# Patient Record
Sex: Female | Born: 1935 | Race: Black or African American | Hispanic: No | Smoking: Never smoker
Health system: Southern US, Community
[De-identification: ages and names within clinical notes are randomized; demographics above are authoritative.]

## PROBLEM LIST (undated history)

## (undated) DIAGNOSIS — K449 Diaphragmatic hernia without obstruction or gangrene: Secondary | ICD-10-CM

## (undated) DIAGNOSIS — I10 Essential (primary) hypertension: Secondary | ICD-10-CM

## (undated) DIAGNOSIS — E7800 Pure hypercholesterolemia, unspecified: Secondary | ICD-10-CM

## (undated) DIAGNOSIS — K5790 Diverticulosis of intestine, part unspecified, without perforation or abscess without bleeding: Secondary | ICD-10-CM

## (undated) DIAGNOSIS — K219 Gastro-esophageal reflux disease without esophagitis: Secondary | ICD-10-CM

## (undated) HISTORY — PX: HYSTEROTOMY: SHX1776

## (undated) HISTORY — PX: LIVER BIOPSY: SHX301

## (undated) HISTORY — PX: ABDOMINAL HYSTERECTOMY: SHX81

---

## 2003-03-21 ENCOUNTER — Encounter: Admission: RE | Admit: 2003-03-21 | Discharge: 2003-03-21 | Payer: Self-pay | Admitting: Internal Medicine

## 2003-03-21 ENCOUNTER — Encounter: Payer: Self-pay | Admitting: Internal Medicine

## 2005-08-22 ENCOUNTER — Encounter: Admission: RE | Admit: 2005-08-22 | Discharge: 2005-08-22 | Payer: Self-pay | Admitting: Internal Medicine

## 2005-10-25 ENCOUNTER — Emergency Department (HOSPITAL_COMMUNITY): Admission: EM | Admit: 2005-10-25 | Discharge: 2005-10-26 | Payer: Self-pay | Admitting: Emergency Medicine

## 2006-01-09 ENCOUNTER — Emergency Department (HOSPITAL_COMMUNITY): Admission: EM | Admit: 2006-01-09 | Discharge: 2006-01-10 | Payer: Self-pay | Admitting: Emergency Medicine

## 2007-03-25 ENCOUNTER — Emergency Department (HOSPITAL_COMMUNITY): Admission: EM | Admit: 2007-03-25 | Discharge: 2007-03-25 | Payer: Self-pay | Admitting: Emergency Medicine

## 2007-06-02 ENCOUNTER — Emergency Department (HOSPITAL_COMMUNITY): Admission: EM | Admit: 2007-06-02 | Discharge: 2007-06-03 | Payer: Self-pay | Admitting: Emergency Medicine

## 2007-06-18 ENCOUNTER — Emergency Department (HOSPITAL_COMMUNITY): Admission: EM | Admit: 2007-06-18 | Discharge: 2007-06-19 | Payer: Self-pay | Admitting: Emergency Medicine

## 2007-06-19 ENCOUNTER — Encounter (INDEPENDENT_AMBULATORY_CARE_PROVIDER_SITE_OTHER): Payer: Self-pay | Admitting: *Deleted

## 2007-07-11 ENCOUNTER — Inpatient Hospital Stay (HOSPITAL_COMMUNITY): Admission: EM | Admit: 2007-07-11 | Discharge: 2007-07-16 | Payer: Self-pay | Admitting: Emergency Medicine

## 2007-07-12 ENCOUNTER — Encounter: Payer: Self-pay | Admitting: Gastroenterology

## 2007-07-12 ENCOUNTER — Encounter (INDEPENDENT_AMBULATORY_CARE_PROVIDER_SITE_OTHER): Payer: Self-pay | Admitting: *Deleted

## 2007-07-19 ENCOUNTER — Ambulatory Visit: Payer: Self-pay | Admitting: Gastroenterology

## 2007-08-31 ENCOUNTER — Encounter: Payer: Self-pay | Admitting: Gastroenterology

## 2009-04-18 DIAGNOSIS — M1990 Unspecified osteoarthritis, unspecified site: Secondary | ICD-10-CM | POA: Insufficient documentation

## 2009-04-18 DIAGNOSIS — M810 Age-related osteoporosis without current pathological fracture: Secondary | ICD-10-CM | POA: Insufficient documentation

## 2009-04-18 DIAGNOSIS — K219 Gastro-esophageal reflux disease without esophagitis: Secondary | ICD-10-CM | POA: Insufficient documentation

## 2009-12-21 DIAGNOSIS — R42 Dizziness and giddiness: Secondary | ICD-10-CM | POA: Insufficient documentation

## 2010-01-05 ENCOUNTER — Emergency Department (HOSPITAL_COMMUNITY): Admission: EM | Admit: 2010-01-05 | Discharge: 2010-01-05 | Payer: Self-pay | Admitting: Emergency Medicine

## 2010-03-21 ENCOUNTER — Encounter: Payer: Self-pay | Admitting: Gastroenterology

## 2010-03-22 ENCOUNTER — Telehealth: Payer: Self-pay | Admitting: Gastroenterology

## 2010-03-25 ENCOUNTER — Emergency Department (HOSPITAL_COMMUNITY): Admission: EM | Admit: 2010-03-25 | Discharge: 2010-03-26 | Payer: Self-pay | Admitting: Emergency Medicine

## 2010-07-16 NOTE — Procedures (Signed)
Summary: EGD and biopsy   EGD  Procedure date:  07/12/2007  Findings:      Findings: Duodenitis  Findings: Gastritis, Hiatal Hernia, GERD Location: [facility]    EGD  Procedure date:  07/12/2007  Findings:      Findings: Duodenitis  Findings: Gastritis, Hiatal Hernia, GERD Location: [facility]   Patient Name: [name], [name] MRN:  Procedure Procedures: Panendoscopy (EGD) CPT: 43235.    with biopsy(s)/brushing(s). CPT: D1846139.  Personnel: Endoscopist: Venita Lick. Russella Dar, MD, Clementeen Graham.  Referred By: [name], MD.  Exam Location: Exam performed in Endoscopy Suite. Inpatient-ward  Patient Consent: Procedure, Alternatives, Risks and Benefits discussed, consent obtained, from patient. Consent was obtained by the RN.  Indications Symptoms: Weight loss. Nausea. Anorexia. Reflux symptoms  History  Current Medications: Patient is not currently taking Coumadin.  Pre-Exam Physical: Performed Jul 12, 2007  Cardio-pulmonary exam, HEENT exam, Abdominal exam, Mental status exam WNL.  Comments: Pt. history reviewed/updated, physical exam performed prior to initiation of sedation?Yes Exam Exam Info: Maximum depth of insertion Duodenum, intended Duodenum. Vocal cords not visualized. Gastric retroflexion performed. ASA Classification: II. Tolerance: excellent.  Sedation Meds: Patient assessed and found to be appropriate for moderate (conscious) sedation. Fentanyl 50 mcg. given IV. Versed 4 mg. given IV. Cetacaine Spray 2 sprays given aerosolized.  Monitoring: BP and pulse monitoring done. Oximetry used. Supplemental O2 given  Findings Normal: Proximal Esophagus to Distal Esophagus.  HIATAL HERNIA: Regular, 4 cms. in length. ICD9: Hernia, Hiatal: 553.3. Normal: Fundus to Body.  MUCOSAL ABNORMALITY: Duodenal Bulb. Erythematous mucosa. ICD9: Duodenitis without Hemorrhage: 535.60.  MUCOSAL ABNORMALITY: Antrum to Pyloric Sphincter. Erosions present. Erythematous  mucosa. Granular mucosa. Biopsy/Mucosal Abn taken. ICD9: Gastritis, Unspecified: 535.50.  Normal: Duodenal 2nd Portion.   Assessment  Diagnoses: 553.3: Hernia, Hiatal.  535.50: Gastritis, Unspecified.  535.60: Duodenitis without Hemorrhage.  530.81: GERD.   Events  Unplanned Intervention: No unplanned interventions were required.  Unplanned Events: There were no complications. Plans Medication(s): Await pathology. PPI: QAM,   Patient Education: Patient given standard instructions for: Hiatal Hernia. Reflux. Mucosal Abnormality.  Comments: FINDING ON EGD DO NOT SEEM SIGNIFICANT ENOUGH TO EXPLAIN ALL HER SYMPTOMS BUT COULD EXPLAIN SOME SYMPTOMS. Disposition: After procedure patient sent to recovery. After recovery patient sent back to hospital.  Scheduling: Primary Care Provider, to [name], MD, Jul 12, 2007.    This report was created from the original endoscopy report, which was reviewed and signed by the above listed endoscopist.    cc: [name], MD         SP-Surgical Pathology - STATUS: Final  .                                         Perform Date: 26Jan09 00:01  Ordered By: Rica Records Date: 26Jan09 12:10  Facility: [facility]                              Department: CPATH  Service Report Text  [facility]   [address] [place], [place] [address]   [phone]    REPORT OF SURGICAL PATHOLOGY    Case #: [id number]   Patient Name: BOBIE, CARIS   Office Chart Number: N/A    MRN: [id number]  Pathologist: Ferd Hibbs. Colonel Bald, MD   DOB/Age 15-Aug-1924 (Age: 34) Gender: F   Date Taken: 07/12/2007   Date Received: 07/12/2007    FINAL DIAGNOSIS    ***MICROSCOPIC EXAMINATION AND DIAGNOSIS***    STOMACH, BIOPSY:   - MILD REACTIVE GASTROPATHY.   - THERE IS NO EVIDENCE OF MALIGNANCY.   - SEE COMMENT.    COMMENT   This pattern can be associated with nonsteroidal   anti-inflammatory drugs (NSAIDS),  alcohol or with other causes of   chemical gastropathy. Helicobacter pylori are not identified   with Warthin-Starry stain. The control stained appropriately.    gdt   Date Reported: 07/13/2007 Ferd Hibbs. Colonel Bald, MD   *** Electronically Signed Out By JBK ***    Clinical information   Gastritis and duodenitis (tc)    specimen(s) obtained   Stomach, biopsy    Gross Description   Received in formalin are tan, soft tissue fragments that are   submitted in toto. Number: three   Size: 0.1 to 0.3 cm (JBM:kv 07-12-07)    kv/   Additional Information  HL7 RESULT STATUS : F  External IF Update Timestamp : 2007-07-12:12:10:00.000000

## 2010-07-16 NOTE — Progress Notes (Signed)
Summary: appt concern  Phone Note Call from Patient Call back at Home Phone [phone]   Caller: Patient Call For: Dr. Russella Dar Reason for Call: Talk to Nurse Summary of Call: pt thinks she is returning a call to "Dr. Ardell Isaacs nurse" regarding a questions about a future appt...? Initial call taken by: Vallarie Mare,  March 22, 2010 9:53 AM  Follow-up for Phone Call        Pt states she is feeling better and wants to cancel her appt for Monday and will call back if she wants to reschedule.  Follow-up by: Christie Nottingham CMA Duncan Dull),  March 22, 2010 10:05 AM

## 2010-07-16 NOTE — Letter (Signed)
Summary: New Patient letter  [facility]  [address] [place], [place] [address]   Phone: [phone]  Fax: [phone]       03/21/2010 MRN: [id number]  [name] [address] [place], [place]  [address]  Dear [name],  Welcome to the Gastroenterology Division at [facility].    You are scheduled to see Dr.  Russella Dar on 03-25-10 at 10am on the 3rd floor at [facility], [address].  We ask that you try to arrive at our office 15 minutes prior to your appointment time to allow for check-in.  We would like you to complete the enclosed self-administered evaluation form prior to your visit and bring it with you on the day of your appointment.  We will review it with you.  Also, please bring a complete list of all your medications or, if you prefer, bring the medication bottles and we will list them.  Please bring your insurance card so that we may make a copy of it.  If your insurance requires a referral to see a specialist, please bring your referral form from your primary care physician.  Co-payments are due at the time of your visit and may be paid by cash, check or credit card.     Your office visit will consist of a consult with your physician (includes a physical exam), any laboratory testing he/she may order, scheduling of any necessary diagnostic testing (e.g. x-ray, ultrasound, CT-scan), and scheduling of a procedure (e.g. Endoscopy, Colonoscopy) if required.  Please allow enough time on your schedule to allow for any/all of these possibilities.    If you cannot keep your appointment, please call [phone] to cancel or reschedule prior to your appointment date.  This allows [place] the opportunity to schedule an appointment for another patient in need of care.  If you do not cancel or reschedule by 5 p.m. the business day prior to your appointment date, you will be charged a $50.00 late cancellation/no-show fee.    Thank you for choosing [facility] for your medical needs.  We appreciate the opportunity to care for you.  Please visit [place] at our website  to learn more about our practice.                     Sincerely,                                                             The Gastroenterology Division

## 2010-07-16 NOTE — Discharge Summary (Signed)
Summary: Nausea  NAME:  Madison, Proctor NO.:  [id number]      MEDICAL RECORD NO.:  [id number]          PATIENT TYPE:  INP      LOCATION:  1312                         FACILITY:  [facility]      PHYSICIAN:  Gwen Pounds, MD       DATE OF BIRTH:  Dec 17, 1924      DATE OF ADMISSION:  07/11/2007   DATE OF DISCHARGE:  07/16/2007                                  DISCHARGE SUMMARY      PRIMARY CARE PROVIDER:  Dr. Gwen Pounds.      GASTROENTEROLOGIST:  Dr. Russella Dar.      DISCHARGE DIAGNOSES:   1. Clostridium difficile colitis.   2. Nausea and anorexia.   3. Hypertension.   4. Weakness/failure to thrive.   5. Malnutrition.   6. Hypokalemia, status post repletion.   7. Known hiatal hernia, known gastroesophageal reflux disease and       known presbyesophagus.   8. Hyperlipidemia.   9. Osteoporosis.      DISCHARGE MEDICATIONS:  List includes:   1. Atenolol mg p.o. q.a.m.   2. Norvasc 10 mg p.o. daily.   3. Lipitor 40 mg p.o. q.a.m.   4. Aspirin 325 daily.   5. Protonix 40 mg p.o. daily.   6. Multivitamin one p.o. daily.   7. Vancomycin 125 mg in 2.5-mL solution four times per day through       July 26, 2007.   8. Cipro eye drops 0.3% two drops 3 times per day.   9. Florastor 250 mg p.o. b.i.d. through July 26 2007.   10.Tylenol as needed.   11.Benicar 40 mg, only take one-half per day; if too lightheaded,       dizzy or weak, hold this medication and call me.   12.Metoclopramide 10 mg one tablet 3 times per day if needed for       nausea.   13.Discontinue the hydrochlorothiazide.      AFTERCARE FOLLOWUP INSTRUCTIONS:  Follow up with Dr. Timothy Lasso in 2 weeks or   earlier if needed.      DISCHARGE PROCEDURES:   1. Gastroenterology consultation.   2. CT scan of the abdomen and pelvis showing small hiatal hernia,       stable left adrenal adenoma, colon diverticulosis, stable nodule on       the pancreatic head and sigmoid diverticulosis.   3. Medical management.        HISTORY OF PRESENT ILLNESS:  Briefly, [name] is an 77 year old   female who has been sick since July 19, 2007 with decreased appetite,   dry heaves, loose brown stools and decreased p.o. intake.  She finally   presented to medical attention on July 11, 2007 with worsening   anorexia, worsening oral intake, mild lightheadedness, vague lower chest   wall, substernal and chest discomfort, worsening GI issues, nausea and   diarrhea.  She denies any fevers or chills.  She has been on Flagyl and   Cipro for presumed diverticulitis from right around the [place]  Year's on.   She presented to the emergency department on July 11, 2007 with the   above-mentioned worsening symptoms; she was seen and examined.  She had   a relatively mild abdominal exam.  Her white count was 6.2.  Her albumin   was 3.  The rest of her exam lab work was unremarkable.  Potassium was   3.3.  EKG showed a normal sinus rhythm.  CT scan showed the above.  She   was admitted for further evaluation and treatment of nausea, weight loss   and failure to thrive.      HOSPITAL COURSE:  I saw her in followup the following morning on July 12, 2007 after she was admitted with continued nausea, vomiting,   diarrhea, failure to thrive, anorexia and weight loss.  I updated the   chart and wrote that she was diagnosed with diverticulitis on June 19, 2007 and took 10-11 days of Cipro and Flagyl; since then, she has had   her issues.  I saw her on July 12, 2007; she had a soft bowel   movement, no blood, no melena and she was having severe abdominal pain   overnight.  Her abdominal exam was unremarkable and was soft, nontender   and nondistended.  Bowel sounds were positive.  She had no rebound or   guarding.  All of her labs were reviewed.  She was Hemoccult-negative.   I went ahead and asked GI to see the patient because her abdominal exam   was out of portion to her symptoms and wanted to verify that  nothing   else was going on at the same time. Stool studies were sent.  Dr. Russella Dar   saw the patient and thought that we needed to go ahead and rule out GERD   and ulcer, Candida esophagitis, gastritis or incompletely treated   diverticulitis and actually went ahead and did an upper endoscopy.  Of   note, the upper endoscopy was relatively benign.  Biopsies done at the   time showed mild reactive gastropathy and no evidence of malignancy, but   there was a hiatal hernia and mild gastritis and mild duodenitis and   mild GERD, otherwise, again unremarkable.  Of note, later that day, the   C. difficile colitis toxin came back positive and vancomycin orally was   added and the Flagyl was discontinued.  The opinion was that because she   had just finished a course of Flagyl, that the Flagyl was ineffective at   protecting her from this, so we will go ahead and do the vancomycin for   a full course.  We also treated her with cholestyramine and probiotics.   We continued to rehydrate her and correct her electrolyte abnormalities.   Over the next ensuing days, her diarrhea slowly resolved, her p.o.   intake slowly improved, physical and occupational therapy help get her   closer to her baseline.  On July 16, 2007, she had 2 loose bowel   movements the day before, more firm, less abdominal pain, she was eating   more and having more strength and walking more.  All her vital signs   remained stable.  Her labs just had a prealbumin of 7.8, potassium was a   little low at 3.3, hemoglobin was 10.9, but her physical exam was   otherwise unremarkable, her strength was improving, as stated above, and   for her C. difficile colitis, we felt it was okay  for her to be   discharged home for a full 14-day course of vancomycin and probiotics.   At home, I want her to again increase her strength and eat more.  Her   family is going to pay very close attention to her until she returns to   her baseline.  She  did have what appeared to be a conjunctivitis and was   put on Cipro eye drops.  It was felt that we can go ahead and tighten   her   blood pressure and work on her medications as an outpatient.  On July 16, 2007, she was deemed medically stable and was discharged home in   stable condition to be closely evaluated by her family and to follow up   with me in 2 weeks after discharge.               Gwen Pounds, MD   Electronically Signed            JMR/MEDQ  D:  08/31/2007  T:  09/01/2007  Job:  [id number]      cc:   Venita Lick. Russella Dar, MD, FACG   [address]   [place]   [place] [address]

## 2010-08-31 LAB — COMPREHENSIVE METABOLIC PANEL
ALT: 15 U/L (ref 0–35)
Albumin: 3.4 g/dL — ABNORMAL LOW (ref 3.5–5.2)
Calcium: 9 mg/dL (ref 8.4–10.5)
Chloride: 106 mEq/L (ref 96–112)
Creatinine, Ser: 1.01 mg/dL (ref 0.4–1.2)
GFR calc Af Amer: >60 mL/min (ref >60)
Glucose, Bld: 103 mg/dL — ABNORMAL HIGH (ref 70–99)
Potassium: 3.9 mEq/L (ref 3.5–5.1)
Sodium: 138 mEq/L (ref 135–145)
Total Bilirubin: 0.5 mg/dL (ref 0.3–1.2)

## 2010-08-31 LAB — CBC
HCT: 35.1 % — ABNORMAL LOW (ref 36.0–46.0)
Hemoglobin: 11.2 g/dL — ABNORMAL LOW (ref 12.0–15.0)
MCHC: 31.9 g/dL (ref 30.0–36.0)
RDW: 16.7 % — ABNORMAL HIGH (ref 11.5–15.5)

## 2010-08-31 LAB — DIFFERENTIAL
Basophils Absolute: 0 10*3/uL (ref 0.0–0.1)
Basophils Relative: 0 % (ref 0–1)
Monocytes Absolute: 0.5 10*3/uL (ref 0.1–1.0)
Monocytes Relative: 9 % (ref 3–12)
Neutro Abs: 4 10*3/uL (ref 1.7–7.7)
Neutrophils Relative %: 72 % (ref 43–77)

## 2010-08-31 LAB — POCT CARDIAC MARKERS
CKMB, poc: 2.8 ng/mL (ref 1.0–8.0)
CKMB, poc: 3.5 ng/mL (ref 1.0–8.0)
Troponin i, poc: <0.05 ng/mL (ref 0.00–0.09)

## 2010-10-29 NOTE — H&P (Signed)
NAMEHARRIETT, [name] NO.:  [id number]   MEDICAL RECORD NO.:  [id number]          PATIENT TYPE:  INP   LOCATION:  1313                         FACILITY:  [facility]   PHYSICIAN:  Barry Dienes. Eloise Harman, M.D.DATE OF BIRTH:  03-Nov-1924   DATE OF ADMISSION:  07/11/2007  DATE OF DISCHARGE:                              HISTORY & PHYSICAL   CHIEF COMPLAINT:  Persistent nausea.   HISTORY OF PRESENT ILLNESS:  The patient is an 77 year old black female  who has had malaise since June 18, 2007.  Since that time she has had  decreased appetite with nausea, dry heaves and loose brown stools two to  three times daily.  She has had very little food and fluid intake in the  past 2 days.  She also describes mild lightheadedness; mild, vague left  lower chest wall, and substernal chest discomfort all of the time for  the past few days.  She describes, what sounds like, mild food fear with  certain foods that she avoids due to associated nausea or diarrhea.  She  has not had any recent fever or chills.  She has been on Flagyl and  ciprofloxacin for presumed diverticulitis, that she stopped 2 days ago.   PAST MEDICAL HISTORY:  1. Hiatal hernia with gastroesophageal reflux disease and      presbyesophagus, diagnosed in March 2007.  2. Hypertension.  3. Hyperlipidemia.  4. Osteoporosis.   MEDICATIONS PRIOR TO ADMISSION:  1. Atenolol one tablet daily.  2. Norvasc one tablet daily.  3. Hydrochlorothiazide one tablet daily.  4. Lipitor one tablet daily.  5. Aspirin daily.  6. Prevacid one tablet daily.  7. Multivitamin.  8. Darvocet N 100 p.r.n.  9. Fosamax one tablet once weekly.   ALLERGIES:  IODINE (has been associated with hives).   PAST SURGICAL HISTORY:  Total abdominal hysterectomy for fibroids.   FAMILY HISTORY:  There are close relatives who have had diabetes  mellitus, but none that have had early heart disease, colon cancer or  breast cancer.   SOCIAL HISTORY:  She  is a widow and lives with two grandsons.  She has  two sons and two daughters.  She had one daughter who died from  complications of multiple sclerosis.  Her son feels that she has been  less interactive with friends in the past few months.  Point of contact  for the patient is her son, Shaka Zech at telephone [phone].   REVIEW OF SYSTEMS:  She has had a pervasive decreased energy over the  past few weeks with mild dysphagia.  She denies substernal chest pain,  chills, shortness of breath, exertional chest pain, abdominal pain,  arthralgias, anxiety.  Her son suggests that she is less involved with  friends and showing mild signs of depression.  She has lost several  friends and family members in the past few months.   INITIAL PHYSICAL EXAMINATION:  VITAL SIGNS:  Blood pressure 156/87,  pulse 84, respirations 20, temperature 98.9, pulse oxygen saturation 99%  on room air.  GENERAL:  She is an elderly black female who  is in no apparent distress,  while lying fully supine.  HEENT:  Exam was within normal limits.  NECK:  Supple without jugular venous distention or carotid bruits.  CHEST: Clear to auscultation.  HEART:  Had a regular rate and rhythm, with a systolic ejection murmur  grade 1/6 at the left sternal border.  ABDOMEN:  Normal bowel sounds and no hepatosplenomegaly.  There is a  vertical infraumbilical incision line that is well-healed, consistent  with total abdominal hysterectomy.  EXTREMITIES:  Were without cyanosis, clubbing, or edema.  NEUROLOGICAL EXAM:  She was alert but had a somewhat flat affect.  She  did answer questions appropriately, although she is dependent on family  members to elaborate on her symptoms.  She was able to move all  extremities well.  She had no focal neurologic deficits.   INITIAL LABORATORY STUDIES:  White blood cell count 6.2, hemoglobin 11,  hematocrit 35, platelets 282, serum sodium 138, potassium 3.3, chloride  102, carbon dioxide 27,  BUN 7, creatinine 0.67, glucose 83, total  protein 6.0, albumin 3.0, ALT 13.  URINALYSIS:  Specific gravity 1.017,  nitrite negative.  An EKG  Showed a normal sinus rhythm with PACs, nonspecific T-wave  abnormalities.   CT scan of the abdomen and pelvis with oral contrast only showed the  following:  1. Small hiatal hernia.  2. Stable left adrenal adenoma.  3. Colonic diverticulosis.  4. Stable nodule in the pancreatic head.  5. Sigmoid diverticulosis.   IMPRESSION AND PLAN:  1. Nausea with weight loss:  It is unclear what is causing her      symptoms.  Gastrointestinal upset from recent broad-spectrum      antibiotics could cause nausea and some loose bowel movements.      Gastritis seems unlikely on a proton pump inhibitor chronically.      Her abdomen exam was benign and her white blood cell count was      normal, which argues against C.  difficile.  Her flat affect      suggests possible underlying depression.  Intestinal ischemia      certainly could give weight loss with food fear and a benign      abdomen exam.  However, this is not commonly seen without other      clear evidence of atherosclerotic disease.  I plan to start      treatment with Remeron 30 mg p.o. q.h.s.  She will be given      moderate-dose IV fluids.  We will consider obtaining a CT      angiogram; however, she would require high-dose corticosteroids in      view of her iodine allergy.  We will also check stool      specimens for occult blood and C. difficile toxin.  2. Hypokalemia:  Mild and likely due to hydrochlorothiazide treatment,      which will be held for now.  We will consider starting an ACE      inhibitor with Norvasc if her blood pressure remains somewhat      elevated.           ______________________________  Barry Dienes. Eloise Harman, M.D.     DGP/MEDQ  D:  07/11/2007  T:  07/12/2007  Job:  [id number]   cc:   Gwen Pounds, MD  Fax: [phone]

## 2010-11-01 NOTE — Discharge Summary (Signed)
NAMEMILLIANI, HERRADA NO.:  [id number]   MEDICAL RECORD NO.:  [id number]          PATIENT TYPE:  INP   LOCATION:  1312                         FACILITY:  [facility]   PHYSICIAN:  Gwen Pounds, MD       DATE OF BIRTH:  1924-08-13   DATE OF ADMISSION:  07/11/2007  DATE OF DISCHARGE:  07/16/2007                               DISCHARGE SUMMARY   PRIMARY CARE [place]:  Dr. Gwen Pounds.   GASTROENTEROLOGIST:  Dr. Russella Dar.   DISCHARGE DIAGNOSES:  1. Clostridium difficile colitis.  2. Nausea and anorexia.  3. Hypertension.  4. Weakness/failure to thrive.  5. Malnutrition.  6. Hypokalemia, status post repletion.  7. Known hiatal hernia, known gastroesophageal reflux disease and      known presbyesophagus.  8. Hyperlipidemia.  9. Osteoporosis.   DISCHARGE MEDICATIONS:  List includes:  1. Atenolol mg p.o. q.a.m.  2. Norvasc 10 mg p.o. daily.  3. Lipitor 40 mg p.o. q.a.m.  4. Aspirin 325 daily.  5. Protonix 40 mg p.o. daily.  6. Multivitamin one p.o. daily.  7. Vancomycin 125 mg in 2.5-mL solution four times per day through      July 26, 2007.  8. Cipro eye drops 0.3% two drops 3 times per day.  9. Florastor 250 mg p.o. b.i.d. through July 26 2007.  10.Tylenol as needed.  11.Benicar 40 mg, only take one-half per day; if too lightheaded,      dizzy or weak, hold this medication and call me.  12.Metoclopramide 10 mg one tablet 3 times per day if needed for      nausea.  13.Discontinue the hydrochlorothiazide.   AFTERCARE FOLLOWUP INSTRUCTIONS:  Follow up with Dr. Timothy Lasso in 2 weeks or  earlier if needed.   DISCHARGE PROCEDURES:  1. Gastroenterology consultation.  2. CT scan of the abdomen and pelvis showing small hiatal hernia,      stable left adrenal adenoma, colon diverticulosis, stable nodule on      the pancreatic head and sigmoid diverticulosis.  3. Medical management.   HISTORY OF PRESENT ILLNESS:  Briefly, Ms. Maneh Sieben is an 77 year old  female  who has been sick since July 19, 2007 with decreased appetite,  dry heaves, loose brown stools and decreased p.o. intake.  She finally  presented to medical attention on July 11, 2007 with worsening  anorexia, worsening oral intake, mild lightheadedness, vague lower chest  wall, substernal and chest discomfort, worsening GI issues, nausea and  diarrhea.  She denies any fevers or chills.  She has been on Flagyl and  Cipro for presumed diverticulitis from right around the [place] on.  She presented to the emergency department on July 11, 2007 with the  above-mentioned worsening symptoms; she was seen and examined.  She had  a relatively mild abdominal exam.  Her white count was 6.2.  Her albumin  was 3.  The rest of her exam lab work was unremarkable.  Potassium was  3.3.  EKG showed a normal sinus rhythm.  CT scan showed the above.  She  was admitted for further  evaluation and treatment of nausea, weight loss  and failure to thrive.   HOSPITAL COURSE:  I saw her in followup the following morning on July 12, 2007 after she was admitted with continued nausea, vomiting,  diarrhea, failure to thrive, anorexia and weight loss.  I updated the  chart and wrote that she was diagnosed with diverticulitis on June 19, 2007 and took 10-11 days of Cipro and Flagyl; since then, she has had  her issues.  I saw her on July 12, 2007; she had a soft bowel  movement, no blood, no melena and she was having severe abdominal pain  overnight.  Her abdominal exam was unremarkable and was soft, nontender  and nondistended.  Bowel sounds were positive.  She had no rebound or  guarding.  All of her labs were reviewed.  She was Hemoccult-negative.  I went ahead and asked GI to see the patient because her abdominal exam  was out of portion to her symptoms and wanted to verify that nothing  else was going on at the same time. Stool studies were sent.  Dr. Russella Dar  saw the patient and thought that we  needed to go ahead and rule out GERD  and ulcer, Candida esophagitis, gastritis or incompletely treated  diverticulitis and actually went ahead and did an upper endoscopy.  Of  note, the upper endoscopy was relatively benign.  Biopsies done at the  time showed mild reactive gastropathy and no evidence of malignancy, but  there was a hiatal hernia and mild gastritis and mild duodenitis and  mild GERD, otherwise, again unremarkable.  Of note, later that day, the  C. difficile colitis toxin came back positive and vancomycin orally was  added and the Flagyl was discontinued.  The opinion was that because she  had just finished a course of Flagyl, that the Flagyl was ineffective at  protecting her from this, so we will go ahead and do the vancomycin for  a full course.  We also treated her with cholestyramine and probiotics.  We continued to rehydrate her and correct her electrolyte abnormalities.  Over the next ensuing days, her diarrhea slowly resolved, her p.o.  intake slowly improved, physical and occupational therapy help get her  closer to her baseline.  On July 16, 2007, she had 2 loose bowel  movements the day before, more firm, less abdominal pain, she was eating  more and having more strength and walking more.  All her vital signs  remained stable.  Her labs just had a prealbumin of 7.8, potassium was a  little low at 3.3, hemoglobin was 10.9, but her physical exam was  otherwise unremarkable, her strength was improving, as stated above, and  for her C. difficile colitis, we felt it was okay for her to be  discharged home for a full 14-day course of vancomycin and probiotics.  At home, I want her to again increase her strength and eat more.  Her  family is going to pay very close attention to her until she returns to  her baseline.  She did have what appeared to be a conjunctivitis and was  put on Cipro eye drops.  It was felt that we can go ahead and tighten  her  blood pressure  and work on her medications as an outpatient.  On July 16, 2007, she was deemed medically stable and was discharged home in  stable condition to be closely evaluated by her family and to follow up  with me in 2 weeks after discharge.      Gwen Pounds, MD  Electronically Signed     JMR/MEDQ  D:  08/31/2007  T:  09/01/2007  Job:  [id number]   cc:   Venita Lick. Russella Dar, MD, FACG  [address]  [place]  [place] [address]

## 2011-03-06 LAB — URINALYSIS, ROUTINE W REFLEX MICROSCOPIC
Bilirubin Urine: NEGATIVE
Bilirubin Urine: NEGATIVE
Hgb urine dipstick: NEGATIVE
Ketones, ur: 15 — AB
Ketones, ur: 40 — AB
Nitrite: NEGATIVE
Protein, ur: NEGATIVE
Specific Gravity, Urine: 1.02
Specific Gravity, Urine: 1.017
Urobilinogen, UA: 0.2
pH: 6

## 2011-03-06 LAB — COMPREHENSIVE METABOLIC PANEL WITH GFR
ALT: 12
ALT: 13
AST: 16
AST: 17
Albumin: 2.5 — ABNORMAL LOW
Albumin: 3 — ABNORMAL LOW
Alkaline Phosphatase: 51
Alkaline Phosphatase: 61
BUN: 2 — ABNORMAL LOW
BUN: 7
CO2: 26
CO2: 27
Calcium: 7.9 — ABNORMAL LOW
Calcium: 8.6
Chloride: 102
Chloride: 109
Creatinine, Ser: 0.67
Creatinine, Ser: 0.91
GFR calc non Af Amer: 59 — ABNORMAL LOW
GFR calc non Af Amer: >60
Glucose, Bld: 82
Glucose, Bld: 83
Potassium: 3.3 — ABNORMAL LOW
Potassium: 3.7
Sodium: 138
Sodium: 142
Total Bilirubin: 0.8
Total Bilirubin: 0.9
Total Protein: 5.1 — ABNORMAL LOW
Total Protein: 6

## 2011-03-06 LAB — HEPATIC FUNCTION PANEL
ALT: 12
AST: 18
Albumin: 3.6
Alkaline Phosphatase: 69
Bilirubin, Direct: <0.1
Total Bilirubin: 0.8
Total Protein: 7.4

## 2011-03-06 LAB — CBC
HCT: 35.9 — ABNORMAL LOW
HCT: 32.9 — ABNORMAL LOW
HCT: 32.9 — ABNORMAL LOW
HCT: 33.9 — ABNORMAL LOW
HCT: 34.7 — ABNORMAL LOW
Hemoglobin: 11.6 — ABNORMAL LOW
Hemoglobin: 10.5 — ABNORMAL LOW
Hemoglobin: 10.6 — ABNORMAL LOW
Hemoglobin: 10.9 — ABNORMAL LOW
Hemoglobin: 11.3 — ABNORMAL LOW
MCHC: 32.2
MCHC: 32
MCHC: 32.2
MCHC: 32.2
MCV: 69.1 — ABNORMAL LOW
MCV: 69.9 — ABNORMAL LOW
MCV: 69.9 — ABNORMAL LOW
Platelets: 296
Platelets: 261
Platelets: 269
Platelets: 285
RBC: 4.71
RBC: 4.76
RBC: 4.85
RDW: 15.1
RDW: 15.5
RDW: 15.6 — ABNORMAL HIGH
WBC: 5.8
WBC: 6.2
WBC: 6.7
WBC: 9

## 2011-03-06 LAB — BASIC METABOLIC PANEL WITH GFR
BUN: 2 — ABNORMAL LOW
BUN: 5 — ABNORMAL LOW
CO2: 23
CO2: 27
Calcium: 7.7 — ABNORMAL LOW
Calcium: 7.8 — ABNORMAL LOW
Chloride: 103
Chloride: 105
Creatinine, Ser: 0.7
Creatinine, Ser: 0.83
GFR calc non Af Amer: >60
GFR calc non Af Amer: >60
Glucose, Bld: 86
Glucose, Bld: 92
Potassium: 3.3 — ABNORMAL LOW
Potassium: 3.3 — ABNORMAL LOW
Sodium: 134 — ABNORMAL LOW
Sodium: 137

## 2011-03-06 LAB — DIFFERENTIAL
Basophils Absolute: 0
Basophils Relative: 0
Basophils Relative: 0
Eosinophils Absolute: 0.1
Eosinophils Absolute: 0.1
Eosinophils Relative: 1
Monocytes Absolute: 0.2
Monocytes Absolute: 0.7
Monocytes Relative: 2 — ABNORMAL LOW
Monocytes Relative: 12
Neutro Abs: 4.3
Neutrophils Relative %: 70

## 2011-03-06 LAB — CLOSTRIDIUM DIFFICILE EIA

## 2011-03-06 LAB — URINE MICROSCOPIC-ADD ON

## 2011-03-06 LAB — BASIC METABOLIC PANEL
BUN: 7
Creatinine, Ser: 0.84
GFR calc non Af Amer: >60
Potassium: 3.4 — ABNORMAL LOW
Sodium: 136

## 2011-03-06 LAB — OCCULT BLOOD X 1 CARD TO LAB, STOOL
Fecal Occult Bld: NEGATIVE
Fecal Occult Bld: POSITIVE

## 2011-03-06 LAB — LIPASE, BLOOD
Lipase: 22
Lipase: 17

## 2011-03-06 LAB — LACTIC ACID, PLASMA: Lactic Acid, Venous: 0.7

## 2011-03-06 LAB — AMYLASE: Amylase: 43

## 2011-03-06 LAB — URINE CULTURE
Colony Count: NO GROWTH
Culture: NO GROWTH

## 2011-03-06 LAB — CK TOTAL AND CKMB (NOT AT ARMC)
CK, MB: 1.4
Relative Index: INVALID
Total CK: 61

## 2011-03-06 LAB — PREALBUMIN: Prealbumin: 7.8 — ABNORMAL LOW

## 2011-03-06 LAB — TROPONIN I

## 2011-03-27 LAB — URINALYSIS, ROUTINE W REFLEX MICROSCOPIC
Bilirubin Urine: NEGATIVE
Nitrite: NEGATIVE
Specific Gravity, Urine: 1.021
Urobilinogen, UA: 1

## 2011-03-27 LAB — URINE MICROSCOPIC-ADD ON

## 2011-05-30 ENCOUNTER — Other Ambulatory Visit: Payer: Self-pay | Admitting: Internal Medicine

## 2011-05-30 DIAGNOSIS — Z1231 Encounter for screening mammogram for malignant neoplasm of breast: Secondary | ICD-10-CM

## 2011-05-30 DIAGNOSIS — I83899 Varicose veins of unspecified lower extremities with other complications: Secondary | ICD-10-CM | POA: Insufficient documentation

## 2011-05-30 DIAGNOSIS — E559 Vitamin D deficiency, unspecified: Secondary | ICD-10-CM | POA: Insufficient documentation

## 2011-06-30 ENCOUNTER — Ambulatory Visit
Admission: RE | Admit: 2011-06-30 | Discharge: 2011-06-30 | Disposition: A | Discharge status: Home / Self Care | Payer: Self-pay | Source: Ambulatory Visit | Attending: Internal Medicine | Admitting: Internal Medicine

## 2011-06-30 DIAGNOSIS — Z1231 Encounter for screening mammogram for malignant neoplasm of breast: Secondary | ICD-10-CM

## 2011-10-29 ENCOUNTER — Encounter (HOSPITAL_COMMUNITY): Payer: Self-pay

## 2011-10-29 ENCOUNTER — Emergency Department (HOSPITAL_COMMUNITY): Payer: Medicare Other

## 2011-10-29 ENCOUNTER — Emergency Department (HOSPITAL_COMMUNITY)
Admission: EM | Admit: 2011-10-29 | Discharge: 2011-10-30 | Disposition: A | Discharge status: Home / Self Care | Payer: Medicare Other | Attending: Emergency Medicine | Admitting: Emergency Medicine

## 2011-10-29 DIAGNOSIS — Z7982 Long term (current) use of aspirin: Secondary | ICD-10-CM | POA: Insufficient documentation

## 2011-10-29 DIAGNOSIS — I10 Essential (primary) hypertension: Secondary | ICD-10-CM | POA: Insufficient documentation

## 2011-10-29 DIAGNOSIS — R1110 Vomiting, unspecified: Secondary | ICD-10-CM | POA: Insufficient documentation

## 2011-10-29 DIAGNOSIS — R1013 Epigastric pain: Secondary | ICD-10-CM | POA: Insufficient documentation

## 2011-10-29 DIAGNOSIS — E7800 Pure hypercholesterolemia, unspecified: Secondary | ICD-10-CM | POA: Insufficient documentation

## 2011-10-29 DIAGNOSIS — R079 Chest pain, unspecified: Secondary | ICD-10-CM | POA: Insufficient documentation

## 2011-10-29 DIAGNOSIS — K219 Gastro-esophageal reflux disease without esophagitis: Secondary | ICD-10-CM | POA: Insufficient documentation

## 2011-10-29 DIAGNOSIS — R0602 Shortness of breath: Secondary | ICD-10-CM | POA: Insufficient documentation

## 2011-10-29 DIAGNOSIS — Z79899 Other long term (current) drug therapy: Secondary | ICD-10-CM | POA: Insufficient documentation

## 2011-10-29 HISTORY — DX: Essential (primary) hypertension: I10

## 2011-10-29 HISTORY — DX: Pure hypercholesterolemia, unspecified: E78.00

## 2011-10-29 LAB — CBC
Hemoglobin: 10.8 g/dL — ABNORMAL LOW (ref 12.0–15.0)
MCH: 21.8 pg — ABNORMAL LOW (ref 26.0–34.0)
MCHC: 31.1 g/dL (ref 30.0–36.0)

## 2011-10-29 LAB — URINALYSIS, ROUTINE W REFLEX MICROSCOPIC
Glucose, UA: NEGATIVE mg/dL
Leukocytes, UA: NEGATIVE
pH: 7 (ref 5.0–8.0)

## 2011-10-29 LAB — COMPREHENSIVE METABOLIC PANEL
Albumin: 3.4 g/dL — ABNORMAL LOW (ref 3.5–5.2)
BUN: 11 mg/dL (ref 6–23)
Creatinine, Ser: 0.89 mg/dL (ref 0.50–1.10)
Total Protein: 7.4 g/dL (ref 6.0–8.3)

## 2011-10-29 LAB — DIFFERENTIAL
Basophils Relative: 0 % (ref 0–1)
Eosinophils Absolute: 0 10*3/uL (ref 0.0–0.7)
Monocytes Absolute: 0.7 10*3/uL (ref 0.1–1.0)
Monocytes Relative: 8 % (ref 3–12)
Neutrophils Relative %: 74 % (ref 43–77)

## 2011-10-29 LAB — LIPASE, BLOOD: Lipase: 31 U/L (ref 11–59)

## 2011-10-29 MED ORDER — ONDANSETRON HCL 4 MG/2ML IJ SOLN
4.0000 mg | Freq: Once | INTRAMUSCULAR | Status: AC
  Administered 2011-10-29: 4 mg via INTRAVENOUS
  Filled 2011-10-29: qty 2

## 2011-10-29 MED ORDER — SODIUM CHLORIDE 0.9 % IV SOLN
1000.0000 mL | INTRAVENOUS | Status: DC
  Administered 2011-10-29: 1000 mL via INTRAVENOUS

## 2011-10-29 MED ORDER — GI COCKTAIL ~~LOC~~
30.0000 mL | Freq: Once | ORAL | Status: AC
  Administered 2011-10-29: 30 mL via ORAL
  Filled 2011-10-29: qty 30

## 2011-10-29 MED ORDER — PANTOPRAZOLE SODIUM 40 MG IV SOLR
40.0000 mg | Freq: Once | INTRAVENOUS | Status: AC
  Administered 2011-10-29: 40 mg via INTRAVENOUS
  Filled 2011-10-29: qty 40

## 2011-10-29 NOTE — ED Notes (Signed)
Pt complains of generalized chest pain and shortness of breath since Sunday, she states that it hurts when she breathes

## 2011-10-29 NOTE — ED Provider Notes (Signed)
History     CSN: [id number] Arrival date & time 10/29/11  2013 First MD Initiated Contact with Patient 10/29/11 2105      Chief Complaint  Patient presents with  . Chest Pain  . Shortness of Breath    HPI Pt states Sunday she started to feel sick but was ok on Monday.  Yesterday she had an episode of vomiting after taking her vitamin.  Today she started feeling some discomfort in her chest and stomach.  Previously she had similar symptoms in the past and was told it was acid reflux.  She has had an acid taste in her mouth.  Pt recently had a strawberry cake with cream and it might have exacerbated.  No cough, or fever.  She felt a little discomfort with deep breathing earlier but that is generally better now.  She has some slight discomfort with deep breathing.  No vomiting today.  No diarrhea today.   No dysuria.  No history of CAD.  No pe.  No recent trips or travel.  NO leg swelling.  The pain is burning and mostly started yesterday.  Past Medical History  Diagnosis Date  . Hypertension   . High cholesterol     History reviewed. No pertinent past surgical history. (no GB or appy)  History reviewed. No pertinent family history.  History  Substance Use Topics  . Smoking status: Not on file  . Smokeless tobacco: Not on file  . Alcohol Use: No    OB History    Grav Para Term Preterm Abortions TAB SAB Ect Mult Living                  Review of Systems  All other systems reviewed and are negative.    Allergies  Review of patient's allergies indicates no known allergies.  Home Medications   Current Outpatient Rx  Name Route Sig Dispense Refill  . AMLODIPINE BESYLATE 10 MG PO TABS Oral Take 10 mg by mouth daily.    . ASPIRIN 325 MG PO TABS Oral Take 325 mg by mouth daily.    . ATENOLOL 50 MG PO TABS Oral Take 50 mg by mouth daily.    . ATORVASTATIN CALCIUM 80 MG PO TABS Oral Take 80 mg by mouth daily.    Marland Kitchen CALCIUM CARBONATE-VITAMIN D 500-200 MG-UNIT PO TABS Oral Take  1 tablet by mouth daily.    Marland Kitchen VITAMIN D 1000 UNITS PO TABS Oral Take 2,000 Units by mouth daily.    Marland Kitchen FERROUS SULFATE 325 (65 FE) MG PO TABS Oral Take 325 mg by mouth daily with breakfast.    . ADULT MULTIVITAMIN W/MINERALS CH Oral Take 1 tablet by mouth daily.    . OMEGA-3-ACID ETHYL ESTERS 1 G PO CAPS Oral Take 1 g by mouth 2 (two) times daily.    Marland Kitchen OMEPRAZOLE 20 MG PO CPDR Oral Take 20 mg by mouth daily.    Marland Kitchen VALSARTAN 320 MG PO TABS Oral Take 320 mg by mouth daily.      BP 198/97  Pulse 86  Temp(Src) 99.1 F (37.3 C) (Oral)  Resp 18  Wt 150 lb 12.8 oz (68.402 kg)  SpO2 98%  Physical Exam  Nursing note and vitals reviewed. Constitutional: She appears well-developed and well-nourished. No distress.  HENT:  Head: Normocephalic and atraumatic.  Right Ear: External ear normal.  Left Ear: External ear normal.  Eyes: Conjunctivae are normal. Right eye exhibits no discharge. Left eye exhibits no discharge. No scleral  icterus.  Neck: Neck supple. No tracheal deviation present.  Cardiovascular: Normal rate, regular rhythm and intact distal pulses.   Pulmonary/Chest: Effort normal and breath sounds normal. No stridor. No respiratory distress. She has no wheezes. She has no rales.  Abdominal: Soft. Bowel sounds are normal. She exhibits no distension, no abdominal bruit, no ascites and no mass. There is tenderness in the epigastric area. There is no rigidity, no rebound, no guarding, no tenderness at McBurney's point and negative Murphy's sign. No hernia.       Very mild ttp,  Musculoskeletal: She exhibits no edema and no tenderness.  Neurological: She is alert. She has normal strength. No sensory deficit. Cranial nerve deficit:  no gross defecits noted. She exhibits normal muscle tone. She displays no seizure activity. Coordination normal.  Skin: Skin is warm and dry. No rash noted.  Psychiatric: She has a normal mood and affect.    ED Course  Procedures (including critical care time)   Rate: 91  Rhythm: normal sinus rhythm  QRS Axis: normal  Intervals: normal  ST/T Wave abnormalities: normal  Conduction Disutrbances:none  Narrative Interpretation: LVH, early precordial transition  Old EKG Reviewed: none available Labs Reviewed  CBC - Abnormal; Notable for the following:    Hemoglobin 10.8 (*)    HCT 34.7 (*)    MCV 70.1 (*)    MCH 21.8 (*)    RDW 15.6 (*)    All other components within normal limits  COMPREHENSIVE METABOLIC PANEL - Abnormal; Notable for the following:    Albumin 3.4 (*)    GFR calc non Af Amer 58 (*)    GFR calc Af Amer 67 (*)    All other components within normal limits  DIFFERENTIAL  URINALYSIS, ROUTINE W REFLEX MICROSCOPIC  LIPASE, BLOOD  TROPONIN I   Dg Abd Acute W/chest  10/29/2011  *RADIOLOGY REPORT*  Clinical Data: Chest pain, shortness of breath, abdominal pain.  ACUTE ABDOMEN SERIES (ABDOMEN 2 VIEW & CHEST 1 VIEW)  Comparison: Chest 01/05/2010.  Abdomen 07/15/2007.  Findings: Shallow inspiration with developing infiltration or atelectasis in the lung bases since previous study.  Borderline heart size with mild pulmonary vascular congestion.  No edema. Emphysematous changes and scattered fibrosis in the lungs are stable.  Degenerative changes in the thoracic spine.  Calcification of the proximal humerus likely representing chondral there is a bone infarct and stable since previous study.  Gas is stool scattered throughout the colon and in nondistended small bowel loops.  No bowel distension.  No free intra-abdominal air.  No abnormal air fluid levels.  No radiopaque stones. Degenerative changes in the lumbar spine and hips.  IMPRESSION: Mild cardiac enlargement with pulmonary vascular congestion. Emphysematous changes.  New infiltration or atelectasis in both lung bases.  Nonobstructive bowel gas pattern.  Original Report Authenticated By: Marlon Pel, M.D.    1. GERD (gastroesophageal reflux disease)      MDM  Patient is feeling  better after treatment with antacids. I suspect that she's having a recurrent episode.  Patient was describing and acid bitter taste in her mouth.  She has not been having cough or any fever. I suspect the chest x-ray finding is consistent with atelectasis. I did explain the findings to the family and instructed them to be reevaluated she started developing a fever coughing.        Celene Kras, MD 10/30/11 [phone]

## 2011-10-29 NOTE — ED Notes (Signed)
Pt c/o lower mid chest pain that began "a couple days ago". Pt describes pain as burning. Pt also c/o nausea but denies vomiting or diarrhea. Pt states she has hx of reflux. Pt denies SOB but states it hurts to take a deep breath.

## 2011-10-30 MED ORDER — OMEPRAZOLE 20 MG PO CPDR: 40.0000 mg | DELAYED_RELEASE_CAPSULE | Freq: Every day | ORAL | Status: DC

## 2011-10-30 NOTE — Discharge Instructions (Signed)
Diet for GERD or PUD Nutrition therapy can help ease the discomfort of gastroesophageal reflux disease (GERD) and peptic ulcer disease (PUD).  HOME CARE INSTRUCTIONS   Eat your meals slowly, in a relaxed setting.   Eat 5 to 6 small meals per day.   If a food causes distress, stop eating it for a period of time.  FOODS TO AVOID  Coffee, regular or decaffeinated.   Cola beverages, regular or low calorie.   Tea, regular or decaffeinated.   Pepper.   Cocoa.   High fat foods, including meats.   Butter, margarine, hydrogenated oil (trans fats).   Peppermint or spearmint (if you have GERD).   Fruits and vegetables if not tolerated.   Alcohol.   Nicotine (smoking or chewing). This is one of the most potent stimulants to acid production in the gastrointestinal tract.   Any food that seems to aggravate your condition.  If you have questions regarding your diet, ask your caregiver or a registered dietitian. TIPS  Lying flat may make symptoms worse. Keep the head of your bed raised 6 to 9 inches (15 to 23 cm) by using a foam wedge or blocks under the legs of the bed.   Do not lay down until 3 hours after eating a meal.   Daily physical activity may help reduce symptoms.  MAKE SURE YOU:   Understand these instructions.   Will watch your condition.   Will get help right away if you are not doing well or get worse.  Document Released: 06/02/2005 Document Revised: 05/22/2011 Document Reviewed: 04/18/2011 [employer] Patient Information 2012 [employer]. 

## 2012-01-20 ENCOUNTER — Other Ambulatory Visit: Payer: Self-pay | Admitting: Internal Medicine

## 2012-01-20 DIAGNOSIS — K219 Gastro-esophageal reflux disease without esophagitis: Secondary | ICD-10-CM

## 2012-01-21 ENCOUNTER — Ambulatory Visit
Admission: RE | Admit: 2012-01-21 | Discharge: 2012-01-21 | Disposition: A | Discharge status: Home / Self Care | Payer: Medicare Other | Source: Ambulatory Visit | Attending: Internal Medicine | Admitting: Internal Medicine

## 2012-01-21 DIAGNOSIS — K219 Gastro-esophageal reflux disease without esophagitis: Secondary | ICD-10-CM

## 2012-08-29 ENCOUNTER — Emergency Department (HOSPITAL_COMMUNITY)
Admission: EM | Admit: 2012-08-29 | Discharge: 2012-08-29 | Disposition: A | Discharge status: Home / Self Care | Payer: Medicare Other | Attending: Emergency Medicine | Admitting: Emergency Medicine

## 2012-08-29 ENCOUNTER — Emergency Department (HOSPITAL_COMMUNITY): Payer: Medicare Other

## 2012-08-29 ENCOUNTER — Encounter (HOSPITAL_COMMUNITY): Payer: Self-pay | Admitting: Emergency Medicine

## 2012-08-29 DIAGNOSIS — E7800 Pure hypercholesterolemia, unspecified: Secondary | ICD-10-CM | POA: Insufficient documentation

## 2012-08-29 DIAGNOSIS — R531 Weakness: Secondary | ICD-10-CM

## 2012-08-29 DIAGNOSIS — Z7982 Long term (current) use of aspirin: Secondary | ICD-10-CM | POA: Insufficient documentation

## 2012-08-29 DIAGNOSIS — I10 Essential (primary) hypertension: Secondary | ICD-10-CM | POA: Insufficient documentation

## 2012-08-29 DIAGNOSIS — Z8719 Personal history of other diseases of the digestive system: Secondary | ICD-10-CM | POA: Insufficient documentation

## 2012-08-29 DIAGNOSIS — R5383 Other fatigue: Secondary | ICD-10-CM | POA: Insufficient documentation

## 2012-08-29 DIAGNOSIS — Z79899 Other long term (current) drug therapy: Secondary | ICD-10-CM | POA: Insufficient documentation

## 2012-08-29 DIAGNOSIS — R5381 Other malaise: Secondary | ICD-10-CM | POA: Insufficient documentation

## 2012-08-29 HISTORY — DX: Gastro-esophageal reflux disease without esophagitis: K21.9

## 2012-08-29 LAB — COMPREHENSIVE METABOLIC PANEL
ALT: 8 U/L (ref 0–35)
Alkaline Phosphatase: 75 U/L (ref 39–117)
BUN: 10 mg/dL (ref 6–23)
CO2: 25 mEq/L (ref 19–32)
Chloride: 100 mEq/L (ref 96–112)
GFR calc Af Amer: 59 mL/min — ABNORMAL LOW (ref >90)
Glucose, Bld: 82 mg/dL (ref 70–99)
Potassium: 3.5 mEq/L (ref 3.5–5.1)
Sodium: 139 mEq/L (ref 135–145)
Total Bilirubin: 0.4 mg/dL (ref 0.3–1.2)

## 2012-08-29 LAB — CBC WITH DIFFERENTIAL/PLATELET
Eosinophils Absolute: 0 10*3/uL (ref 0.0–0.7)
Hemoglobin: 11.4 g/dL — ABNORMAL LOW (ref 12.0–15.0)
Lymphocytes Relative: 27 % (ref 12–46)
Lymphs Abs: 1.2 10*3/uL (ref 0.7–4.0)
MCH: 21.3 pg — ABNORMAL LOW (ref 26.0–34.0)
Monocytes Relative: 7 % (ref 3–12)
Neutro Abs: 2.8 10*3/uL (ref 1.7–7.7)
Neutrophils Relative %: 64 % (ref 43–77)
Platelets: 260 10*3/uL (ref 150–400)
RBC: 5.34 MIL/uL — ABNORMAL HIGH (ref 3.87–5.11)
WBC: 4.3 10*3/uL (ref 4.0–10.5)

## 2012-08-29 LAB — POCT I-STAT TROPONIN I

## 2012-08-29 LAB — URINALYSIS, ROUTINE W REFLEX MICROSCOPIC
Bilirubin Urine: NEGATIVE
Ketones, ur: 15 mg/dL — AB
Nitrite: NEGATIVE
Protein, ur: NEGATIVE mg/dL
pH: 6 (ref 5.0–8.0)

## 2012-08-29 LAB — URINE MICROSCOPIC-ADD ON

## 2012-08-29 MED ORDER — ATENOLOL 50 MG PO TABS
50.0000 mg | ORAL_TABLET | Freq: Every day | ORAL | Status: DC
  Administered 2012-08-29: 50 mg via ORAL
  Filled 2012-08-29: qty 1

## 2012-08-29 MED ORDER — IRBESARTAN 300 MG PO TABS
300.0000 mg | ORAL_TABLET | Freq: Every day | ORAL | Status: DC
  Administered 2012-08-29: 300 mg via ORAL
  Filled 2012-08-29: qty 1

## 2012-08-29 MED ORDER — AMLODIPINE BESYLATE 10 MG PO TABS
10.0000 mg | ORAL_TABLET | Freq: Every day | ORAL | Status: DC
Start: 2012-08-29 — End: 2012-08-29
  Administered 2012-08-29: 10 mg via ORAL
  Filled 2012-08-29: qty 1

## 2012-08-29 MED ORDER — SODIUM CHLORIDE 0.9 % IV SOLN
Freq: Once | INTRAVENOUS | Status: AC
  Administered 2012-08-29: 14:00:00 via INTRAVENOUS

## 2012-08-29 NOTE — ED Notes (Signed)
Pt presenting to ed with c/o "I feel tired and weak and when I eat it feels like it's coming back up. Pt states she does not have an appetite. Pt denies pain at this time. Pt denies nausea and vomiting at this time

## 2012-08-29 NOTE — ED Notes (Signed)
Patient transported to X-ray 

## 2012-08-29 NOTE — ED Provider Notes (Signed)
History     CSN: [id number]  Arrival date & time 08/29/12  1232   First MD Initiated Contact with Patient 08/29/12 1244      Chief Complaint  Patient presents with  . Weakness    (Consider location/radiation/quality/duration/timing/severity/associated sxs/prior treatment) HPI  79 year old female with history of hypertension, hyperlipidemia, and GERD presents with complaints of generalized weakness. Patient states that over the past week she has been feeling weaker than usual. States she would feel good one day and not the next day. She doesn't have an appetite which is not unusual for her. She would eat and drink and some time she would gag without vomiting or having diarrhea. Otherwise she denies fever, chills, headache, vision changes, speech change, chest pain, shortness of breath, back pain, abdominal pain, dysuria, or rash. There has been no medication changes. She does have history of GERD and reports occasional discomfort in her epigastric region. No active chest pain.  Past Medical History  Diagnosis Date  . Hypertension   . High cholesterol   . GERD (gastroesophageal reflux disease)     Past Surgical History  Procedure Laterality Date  . Abdominal hysterectomy      No family history on file.  History  Substance Use Topics  . Smoking status: Never Smoker   . Smokeless tobacco: Not on file  . Alcohol Use: No    OB History   Grav Para Term Preterm Abortions TAB SAB Ect Mult Living                  Review of Systems  Constitutional:       10 Systems reviewed and all are negative for acute change except as noted in the HPI.     Allergies  Iodine  Home Medications   Current Outpatient Rx  Name  Route  Sig  Dispense  Refill  . amLODipine (NORVASC) 10 MG tablet   Oral   Take 10 mg by mouth daily.         Marland Kitchen aspirin 325 MG tablet   Oral   Take 325 mg by mouth daily.         Marland Kitchen atenolol (TENORMIN) 50 MG tablet   Oral   Take 50 mg by mouth daily.          Marland Kitchen atorvastatin (LIPITOR) 80 MG tablet   Oral   Take 80 mg by mouth daily.         . calcium-vitamin D (OSCAL WITH D) 500-200 MG-UNIT per tablet   Oral   Take 1 tablet by mouth daily.         . cholecalciferol (VITAMIN D) 1000 UNITS tablet   Oral   Take 2,000 Units by mouth daily.         . ferrous sulfate 325 (65 FE) MG tablet   Oral   Take 325 mg by mouth daily with breakfast.         . Multiple Vitamin (MULITIVITAMIN WITH MINERALS) TABS   Oral   Take 1 tablet by mouth daily.         Marland Kitchen omega-3 acid ethyl esters (LOVAZA) 1 G capsule   Oral   Take 1 g by mouth 2 (two) times daily.         Marland Kitchen omeprazole (PRILOSEC) 20 MG capsule   Oral   Take 2 capsules (40 mg total) by mouth daily.   14 capsule   0   . valsartan (DIOVAN) 320 MG tablet   Oral  Take 320 mg by mouth daily.           BP 199/86  Pulse 76  Temp(Src) 98.4 F (36.9 C) (Oral)  Resp 20  SpO2 99%  Physical Exam  Nursing note and vitals reviewed. Constitutional: She is oriented to person, place, and time. She appears well-developed and well-nourished. No distress.  Awake, alert, nontoxic appearance  HENT:  Head: Atraumatic.  Mouth/Throat: Oropharynx is clear and moist.  Oral mucosa moist. no evidence of airway obstruction  Eyes: Conjunctivae are normal. Right eye exhibits no discharge. Left eye exhibits no discharge.  Neck: Neck supple. No JVD present.  Cardiovascular: Normal rate and regular rhythm.   Pulmonary/Chest: Effort normal. No respiratory distress. She exhibits no tenderness.  Abdominal: Soft. There is no tenderness. There is no rebound.  Musculoskeletal: She exhibits no edema and no tenderness.  ROM appears intact, no obvious focal weakness  5/5 strength to all 4 extremities.  Intact distal pulses  Neurological: She is alert and oriented to person, place, and time. She has normal strength. No cranial nerve deficit or sensory deficit. She exhibits normal muscle tone. She  displays a negative Romberg sign. Coordination and gait normal. GCS eye subscore is 4. GCS verbal subscore is 5. GCS motor subscore is 6.  Mental status and motor strength appears intact  Skin: No rash noted.  Psychiatric: She has a normal mood and affect.    ED Course  Procedures (including critical care time)   Date: 08/29/2012  Rate: 75  Rhythm: sinus arrhythmia  QRS Axis: normal  Intervals: QT prolonged  ST/T Wave abnormalities: normal  Conduction Disutrbances:none  Narrative Interpretation:   Old EKG Reviewed: unchanged    1:07 PM Pt presents with complaints of generalized weakness x 1 week with decreased appetite.  She has no focal neuro deficits on exam, no facial droops, no concerning finding to suggest stroke.  She is in NAD.  Is afebrile however BP is elevated at 199/86.  Only checked her vital sign. Will check EKG, chest x-ray, troponin, basic labs, UA. Will get GI cocktail. Care discussed with attending.  2:52 PM Patient has persistence hypertension with BP as high as 223/97.  She admits that she did not take her morning medication today. Will give her usual medication and will continue to monitor. Will also obtain head CT with labs to rule out any signs of hypertensive emergency. Patient admits that her blood pressures always remains high even at her doctor's office but states it has been no change in medication.  5:18 PM Blood pressure has improved. Patient currently in no acute distress. Workup is unremarkable and shows no evidence of hypertensive emergency or end organ damage.patient is encouraged to followup with her PCP for further management of her blood pressure and her complaint. Return precautions discussed. Patient voiced understanding and agrees with plan.   Labs Reviewed  CBC WITH DIFFERENTIAL - Abnormal; Notable for the following:    RBC 5.34 (*)    Hemoglobin 11.4 (*)    MCV 69.9 (*)    MCH 21.3 (*)    RDW 15.6 (*)    All other components within normal  limits  COMPREHENSIVE METABOLIC PANEL - Abnormal; Notable for the following:    GFR calc non Af Amer 51 (*)    GFR calc Af Amer 59 (*)    All other components within normal limits  URINALYSIS, ROUTINE W REFLEX MICROSCOPIC - Abnormal; Notable for the following:    APPearance CLOUDY (*)  Ketones, ur 15 (*)    Leukocytes, UA TRACE (*)    All other components within normal limits  TROPONIN I  URINE MICROSCOPIC-ADD ON  POCT I-STAT TROPONIN I   Dg Chest 2 View  08/29/2012  *RADIOLOGY REPORT*  Clinical Data: Weakness  CHEST - 2 VIEW  Comparison: 01/05/2010  Findings: The heart is borderline enlarged.  Low lung volumes. Vascular congestion.  Subsegmental atelectasis at the left base. No pneumothorax.  No pleural effusion.  Stable thoracic spine.  IMPRESSION: Cardiomegaly and vascular congestion.  Bibasilar atelectasis.   Original Report Authenticated By: Jolaine Click, M.D.    Ct Head Wo Contrast  08/29/2012  *RADIOLOGY REPORT*  Clinical Data: Weakness  CT HEAD WITHOUT CONTRAST  Technique:  Contiguous axial images were obtained from the base of the skull through the vertex without contrast.  Comparison: None.  Findings: The brain shows generalized age related atrophy.  There is low density affecting the cerebral hemispheric white matter consistent with chronic small vessel disease.  No sign of acute infarction, mass lesion, hemorrhage, hydrocephalus or extra-axial collection.  The calvarium is unremarkable.  Sinuses, middle ears and mastoids are clear.  IMPRESSION: There are no acute abnormality.  Atrophy and chronic small vessel disease.   Original Report Authenticated By: Paulina Fusi, M.D.      1. Generalized weakness   2. Hypertension       MDM  BP 211/73  Pulse 46  Temp(Src) 98.1 F (36.7 C) (Oral)  Resp 18  SpO2 99%        Fayrene Helper, PA-C 08/29/12 1722

## 2012-08-29 NOTE — ED Notes (Signed)
EKG given to [facility]. [name] states he will give to Dr Manus Gunning.

## 2012-08-29 NOTE — ED Provider Notes (Signed)
Medical screening examination/treatment/procedure(s) were conducted as a shared visit with non-physician practitioner(s) and myself.  I personally evaluated the patient during the encounter  Intermittent generalized weakness and decreased appetite x 1 week.  No vomiting, fever, chest pain SOB. Hypertensive, did not take BP meds today.  No focal neuro deficits.  Glynn Octave, MD 08/29/12 1806

## 2012-09-29 ENCOUNTER — Emergency Department (HOSPITAL_COMMUNITY): Payer: Medicare Other

## 2012-09-29 ENCOUNTER — Observation Stay (HOSPITAL_COMMUNITY)
Admission: EM | Admit: 2012-09-29 | Discharge: 2012-10-01 | DRG: 312 | Disposition: A | Discharge status: Home / Self Care | Payer: Medicare Other | Attending: Internal Medicine | Admitting: Internal Medicine

## 2012-09-29 ENCOUNTER — Encounter (HOSPITAL_COMMUNITY): Payer: Self-pay | Admitting: Emergency Medicine

## 2012-09-29 DIAGNOSIS — I10 Essential (primary) hypertension: Secondary | ICD-10-CM

## 2012-09-29 DIAGNOSIS — D649 Anemia, unspecified: Secondary | ICD-10-CM

## 2012-09-29 DIAGNOSIS — E876 Hypokalemia: Secondary | ICD-10-CM | POA: Insufficient documentation

## 2012-09-29 DIAGNOSIS — I498 Other specified cardiac arrhythmias: Secondary | ICD-10-CM | POA: Insufficient documentation

## 2012-09-29 DIAGNOSIS — T448X5A Adverse effect of centrally-acting and adrenergic-neuron-blocking agents, initial encounter: Secondary | ICD-10-CM | POA: Insufficient documentation

## 2012-09-29 DIAGNOSIS — G8929 Other chronic pain: Secondary | ICD-10-CM

## 2012-09-29 DIAGNOSIS — R55 Syncope and collapse: Secondary | ICD-10-CM

## 2012-09-29 DIAGNOSIS — R109 Unspecified abdominal pain: Secondary | ICD-10-CM | POA: Insufficient documentation

## 2012-09-29 DIAGNOSIS — E785 Hyperlipidemia, unspecified: Secondary | ICD-10-CM

## 2012-09-29 DIAGNOSIS — Z79899 Other long term (current) drug therapy: Secondary | ICD-10-CM | POA: Insufficient documentation

## 2012-09-29 HISTORY — DX: Diaphragmatic hernia without obstruction or gangrene: K44.9

## 2012-09-29 HISTORY — DX: Diverticulosis of intestine, part unspecified, without perforation or abscess without bleeding: K57.90

## 2012-09-29 LAB — CBC WITH DIFFERENTIAL/PLATELET
Eosinophils Relative: 1 % (ref 0–5)
HCT: 34.8 % — ABNORMAL LOW (ref 36.0–46.0)
Hemoglobin: 11.2 g/dL — ABNORMAL LOW (ref 12.0–15.0)
Lymphocytes Relative: 31 % (ref 12–46)
MCV: 68.5 fL — ABNORMAL LOW (ref 78.0–100.0)
Monocytes Absolute: 0.4 10*3/uL (ref 0.1–1.0)
Monocytes Relative: 6 % (ref 3–12)
Neutro Abs: 4.4 10*3/uL (ref 1.7–7.7)
WBC: 7.1 10*3/uL (ref 4.0–10.5)

## 2012-09-29 LAB — POCT I-STAT, CHEM 8
Calcium, Ion: 1.22 mmol/L (ref 1.13–1.30)
Chloride: 106 mEq/L (ref 96–112)
Glucose, Bld: 132 mg/dL — ABNORMAL HIGH (ref 70–99)
HCT: 37 % (ref 36.0–46.0)
TCO2: 25 mmol/L (ref 0–100)

## 2012-09-29 LAB — COMPREHENSIVE METABOLIC PANEL
ALT: 11 U/L (ref 0–35)
Alkaline Phosphatase: 81 U/L (ref 39–117)
BUN: 14 mg/dL (ref 6–23)
CO2: 24 mEq/L (ref 19–32)
Chloride: 103 mEq/L (ref 96–112)
GFR calc Af Amer: 60 mL/min — ABNORMAL LOW (ref >90)
GFR calc non Af Amer: 52 mL/min — ABNORMAL LOW (ref >90)
Glucose, Bld: 126 mg/dL — ABNORMAL HIGH (ref 70–99)
Potassium: 3 mEq/L — ABNORMAL LOW (ref 3.5–5.1)
Sodium: 140 mEq/L (ref 135–145)
Total Bilirubin: 0.3 mg/dL (ref 0.3–1.2)
Total Protein: 7.6 g/dL (ref 6.0–8.3)

## 2012-09-29 LAB — URINALYSIS, ROUTINE W REFLEX MICROSCOPIC
Bilirubin Urine: NEGATIVE
Ketones, ur: NEGATIVE mg/dL
Nitrite: NEGATIVE
Protein, ur: NEGATIVE mg/dL
pH: 7 (ref 5.0–8.0)

## 2012-09-29 LAB — GLUCOSE, CAPILLARY: Glucose-Capillary: 128 mg/dL — ABNORMAL HIGH (ref 70–99)

## 2012-09-29 MED ORDER — SODIUM CHLORIDE 0.9 % IJ SOLN
3.0000 mL | Freq: Two times a day (BID) | INTRAMUSCULAR | Status: DC
  Administered 2012-09-29 – 2012-10-01 (×4): 3 mL via INTRAVENOUS

## 2012-09-29 MED ORDER — ENOXAPARIN SODIUM 40 MG/0.4ML ~~LOC~~ SOLN
40.0000 mg | SUBCUTANEOUS | Status: DC
  Administered 2012-09-29 – 2012-09-30 (×2): 40 mg via SUBCUTANEOUS
  Filled 2012-09-29 (×3): qty 0.4

## 2012-09-29 MED ORDER — HYDRALAZINE HCL 20 MG/ML IJ SOLN
10.0000 mg | Freq: Four times a day (QID) | INTRAMUSCULAR | Status: DC | PRN
  Administered 2012-09-29: 10 mg via INTRAVENOUS
  Filled 2012-09-29: qty 1

## 2012-09-29 MED ORDER — ATORVASTATIN CALCIUM 80 MG PO TABS
80.0000 mg | ORAL_TABLET | Freq: Every day | ORAL | Status: DC
  Administered 2012-09-29 – 2012-10-01 (×3): 80 mg via ORAL
  Filled 2012-09-29 (×3): qty 1

## 2012-09-29 MED ORDER — CALCIUM CARBONATE-VITAMIN D 500-200 MG-UNIT PO TABS
1.0000 | ORAL_TABLET | Freq: Every day | ORAL | Status: DC
  Administered 2012-09-29 – 2012-10-01 (×3): 1 via ORAL
  Filled 2012-09-29 (×3): qty 1

## 2012-09-29 MED ORDER — ZOLPIDEM TARTRATE 5 MG PO TABS
5.0000 mg | ORAL_TABLET | Freq: Every evening | ORAL | Status: DC | PRN
  Filled 2012-09-29: qty 1

## 2012-09-29 MED ORDER — POLYETHYLENE GLYCOL 3350 17 G PO PACK
17.0000 g | PACK | Freq: Every day | ORAL | Status: DC | PRN
  Filled 2012-09-29: qty 1

## 2012-09-29 MED ORDER — ALUM & MAG HYDROXIDE-SIMETH 200-200-20 MG/5ML PO SUSP: 30.0000 mL | Freq: Four times a day (QID) | ORAL | Status: DC | PRN

## 2012-09-29 MED ORDER — IRBESARTAN 300 MG PO TABS
300.0000 mg | ORAL_TABLET | Freq: Every day | ORAL | Status: DC
  Administered 2012-09-29 – 2012-10-01 (×3): 300 mg via ORAL
  Filled 2012-09-29 (×3): qty 1

## 2012-09-29 MED ORDER — OMEGA-3-ACID ETHYL ESTERS 1 G PO CAPS
1.0000 g | ORAL_CAPSULE | Freq: Two times a day (BID) | ORAL | Status: DC
  Administered 2012-09-29 – 2012-10-01 (×4): 1 g via ORAL
  Filled 2012-09-29 (×5): qty 1

## 2012-09-29 MED ORDER — POTASSIUM CHLORIDE 10 MEQ/100ML IV SOLN
10.0000 meq | INTRAVENOUS | Status: AC
  Administered 2012-09-29 – 2012-09-30 (×4): 10 meq via INTRAVENOUS
  Filled 2012-09-29 (×4): qty 100

## 2012-09-29 MED ORDER — SODIUM CHLORIDE 0.9 % IV SOLN
INTRAVENOUS | Status: DC
  Administered 2012-09-29 – 2012-09-30 (×2): via INTRAVENOUS

## 2012-09-29 MED ORDER — ASPIRIN 325 MG PO TABS
325.0000 mg | ORAL_TABLET | Freq: Every day | ORAL | Status: DC
  Administered 2012-09-29 – 2012-10-01 (×3): 325 mg via ORAL
  Filled 2012-09-29 (×3): qty 1

## 2012-09-29 MED ORDER — PANTOPRAZOLE SODIUM 40 MG PO TBEC
40.0000 mg | DELAYED_RELEASE_TABLET | Freq: Every day | ORAL | Status: DC
  Administered 2012-09-29 – 2012-10-01 (×3): 40 mg via ORAL
  Filled 2012-09-29 (×3): qty 1

## 2012-09-29 MED ORDER — ATENOLOL 50 MG PO TABS
50.0000 mg | ORAL_TABLET | Freq: Every day | ORAL | Status: DC
  Administered 2012-09-29: 50 mg via ORAL
  Filled 2012-09-29 (×2): qty 1

## 2012-09-29 MED ORDER — ONDANSETRON HCL 4 MG PO TABS: 4.0000 mg | ORAL_TABLET | Freq: Four times a day (QID) | ORAL | Status: DC | PRN

## 2012-09-29 MED ORDER — POTASSIUM CHLORIDE 10 MEQ/100ML IV SOLN
10.0000 meq | Freq: Once | INTRAVENOUS | Status: AC
  Administered 2012-09-29: 10 meq via INTRAVENOUS
  Filled 2012-09-29: qty 100

## 2012-09-29 MED ORDER — AMLODIPINE BESYLATE 10 MG PO TABS
10.0000 mg | ORAL_TABLET | Freq: Every day | ORAL | Status: DC
  Administered 2012-09-29 – 2012-10-01 (×3): 10 mg via ORAL
  Filled 2012-09-29 (×3): qty 1

## 2012-09-29 MED ORDER — ADULT MULTIVITAMIN W/MINERALS CH
1.0000 | ORAL_TABLET | Freq: Every day | ORAL | Status: DC
  Administered 2012-09-29 – 2012-10-01 (×3): 1 via ORAL
  Filled 2012-09-29 (×3): qty 1

## 2012-09-29 MED ORDER — VITAMIN D3 25 MCG (1000 UNIT) PO TABS
2000.0000 [IU] | ORAL_TABLET | Freq: Every day | ORAL | Status: DC
  Administered 2012-09-29 – 2012-10-01 (×3): 2000 [IU] via ORAL
  Filled 2012-09-29 (×3): qty 2

## 2012-09-29 MED ORDER — ONDANSETRON HCL 4 MG/2ML IJ SOLN
4.0000 mg | Freq: Four times a day (QID) | INTRAMUSCULAR | Status: DC | PRN
  Administered 2012-09-29: 4 mg via INTRAVENOUS
  Filled 2012-09-29: qty 2

## 2012-09-29 MED ORDER — ACETAMINOPHEN 650 MG RE SUPP: 650.0000 mg | Freq: Four times a day (QID) | RECTAL | Status: DC | PRN

## 2012-09-29 MED ORDER — ACETAMINOPHEN 325 MG PO TABS: 650.0000 mg | ORAL_TABLET | Freq: Four times a day (QID) | ORAL | Status: DC | PRN

## 2012-09-29 NOTE — H&P (Signed)
Physician Admission History and Physical     PCP:   Gwen Pounds, MD   Chief Complaint:  Syncope   HPI: [name] is an 79 y.o. female.  Pt of Dr Ferd Hibbs w/ HTN, HLD, chronic abd pain, and chronic anemia who was noted to be found by her family unresponsive on the toilet for about 10 minutes. This was after having a BM.  No focal deficits can be elicited. Did not appear to have prodromal or postictal state. She does not recall the event.   In the ED, her CT head showed NAICA, CXR was unchanged, EKG showed normal intervals and no acute changes,  lactate mildly elevated at 2.4, mild hypokalemia (that was replaced in ED), stable anemia, clean U/A and otherwise unremarkable. Orthostatics normal. She will be admitted for syncope w/u.   Per family, this is how she gets "whenever she is sick".   Review of Systems:  POS for syncope,   Past Medical History: Past Medical History  Diagnosis Date  . Hypertension   . High cholesterol   . GERD (gastroesophageal reflux disease)   . Hiatal hernia   . Diverticular disease    Past Surgical History  Procedure Laterality Date  . Abdominal hysterectomy      Medications: Prior to Admission medications   Medication Sig Start Date End Date Taking? Authorizing Provider  amLODipine (NORVASC) 10 MG tablet Take 10 mg by mouth daily.   Yes Historical Provider, MD  aspirin 325 MG tablet Take 325 mg by mouth daily.   Yes Historical Provider, MD  atenolol (TENORMIN) 50 MG tablet Take 50 mg by mouth daily.   Yes Historical Provider, MD  atorvastatin (LIPITOR) 80 MG tablet Take 80 mg by mouth daily.   Yes Historical Provider, MD  calcium-vitamin D (OSCAL WITH D) 500-200 MG-UNIT per tablet Take 1 tablet by mouth daily.   Yes Historical Provider, MD  cholecalciferol (VITAMIN D) 1000 UNITS tablet Take 2,000 Units by mouth daily.   Yes Historical Provider, MD  Multiple Vitamin (MULITIVITAMIN WITH MINERALS) TABS Take 1 tablet by mouth daily.   Yes Historical  Provider, MD  omega-3 acid ethyl esters (LOVAZA) 1 G capsule Take 1 g by mouth 2 (two) times daily.   Yes Historical Provider, MD  omeprazole (PRILOSEC) 20 MG capsule Take 20 mg by mouth daily. 10/30/11  Yes Celene Kras, MD  valsartan (DIOVAN) 320 MG tablet Take 320 mg by mouth daily.   Yes Historical Provider, MD    Allergies:   Allergies  Allergen Reactions  . Iodine     hives    Social History:  reports that she has never smoked. She does not have any smokeless tobacco history on file. She reports that she does not drink alcohol. Her drug history is not on file.  Family History: History reviewed. No pertinent family history.  Physical Exam: Filed Vitals:   09/29/12 1845 09/29/12 1900 09/29/12 1904 09/29/12 1910  BP: 181/81 183/76 183/76 183/79  Pulse: 65 57 67 72  Temp:      TempSrc:      Resp: 16 14 18 14   SpO2: 100% 100%  100%   Gen: AAF in NAD  HEENT:  Trachea midline, dry MM, EOMI  Cardio: RRR, no MRG  Lungs: CTAB, no wheezes or rales  Abd: nontender, nondistended, normal BS  MSK:  5/5 strength in UE and LE B/L, normal reflexes, normal sensation  Neuro: CN II-XII grossly intact , no focal deficits  Psych: no  signs of agitation or anxiety    Labs on Admission:   Recent Labs  09/29/12 1450 09/29/12 1502  NA 140 144  K 3.0* 2.9*  CL 103 106  CO2 24  --   GLUCOSE 126* 132*  BUN 14 13  CREATININE 0.97 0.90  CALCIUM 9.7  --     Recent Labs  09/29/12 1450  AST 20  ALT 11  ALKPHOS 81  BILITOT 0.3  PROT 7.6  ALBUMIN 3.5    Recent Labs  09/29/12 1517  LIPASE 51    Recent Labs  09/29/12 1450 09/29/12 1502  WBC 7.1  --   NEUTROABS 4.4  --   HGB 11.2* 12.6  HCT 34.8* 37.0  MCV 68.5*  --   PLT 234  --    No results found for this basename: CKTOTAL, CKMB, CKMBINDEX, TROPONINI,  in the last 72 hours No results found for this basename: INR,  PROTIME   No results found for this basename: TSH, T4TOTAL, FREET3, T3FREE, THYROIDAB,  in the last  72 hours No results found for this basename: VITAMINB12, FOLATE, FERRITIN, TIBC, IRON, RETICCTPCT,  in the last 72 hours  Radiological Exams on Admission: Dg Chest 2 View  09/29/2012  *RADIOLOGY REPORT*  Clinical Data: Loss of consciousness.  CHEST - 2 VIEW  Comparison: 08/29/2012.  Findings: Low volume chest.  Basilar atelectasis, greater on the left than right.  The appearance is similar to the prior exam last month.  There is no consolidation.  Exaggerated thoracic kyphosis.  Thoracic spine DISH.  Patient is rotated to the left.  Probable bone infarct in the proximal left humerus appears similar to prior.  IMPRESSION: Low volume chest.  No interval change compared to prior.   Original Report Authenticated By: Andreas Newport, M.D.    Ct Head Wo Contrast  09/29/2012  *RADIOLOGY REPORT*  Clinical Data: Syncope  CT HEAD WITHOUT CONTRAST  Technique:  Contiguous axial images were obtained from the base of the skull through the vertex without contrast.  Comparison: 08/29/2012  Findings: There is no evidence for acute hemorrhage, hydrocephalus, mass lesion, or abnormal extra-axial fluid collection.  No definite CT evidence for acute infarction.  Diffuse loss of parenchymal volume is consistent with atrophy. Patchy low attenuation in the deep hemispheric and periventricular white matter is nonspecific, but likely reflects chronic microvascular ischemic demyelination.  The visualized paranasal sinuses and mastoid air cells are clear.  IMPRESSION: Stable.  No acute intracranial abnormality.  Atrophy with chronic small vessel white matter disease.   Original Report Authenticated By: Kennith Center, M.D.    Orders placed during the hospital encounter of 09/29/12  . EKG 12-LEAD  . EKG 12-LEAD    Assessment/Plan  Admit to Tele bed   Principal Problem:   Syncope - I feel neurogenic vs orthostasis is on top of the list of possible etiologies. Will hydrate w/ NS at 100cc/hr overnight. Orthostatic vitals were  normal in ED, although after receiving IVF. Given no focal deficits or other stroke-like symptoms, will defer on MRI brain in setting of normal CT head. Will check TSH, TTE, CUS, and place on tele to ensure not thyroid, cardiac valvular disease, carotid stenosis or arrhythmia as the cause. If all unremarkable, will likely be ready for discharge after rehydrating overnight.   Active Problems:   Hypertension - continue home meds . IV hydralazine prn    Hyperlipidemia - cont home meds    Anemia - cont PPI given has hx of gastritis  Chronic abdominal pain - tylenol prn pain + PPI and home meds   PPX - PPI and lovenox      Gurshaan Matsuoka 09/29/2012, 7:13 PM

## 2012-09-29 NOTE — ED Notes (Signed)
daughter at bedside

## 2012-09-29 NOTE — ED Notes (Signed)
Report per pt's daughter. Pt was having a bowel movement when her grandson witnessed her passing out. Daughter was not with the pt at this time. Pt is unwilling to speak with the staff. Pt did state that she is having a headache.

## 2012-09-29 NOTE — ED Notes (Signed)
Patient not cooperative and unable to perform orthostatic vital signs. Patient shook bilateral arms for 3 seconds and daughter states who is at bedside did the same 2 previous times. Notified EDP.

## 2012-09-29 NOTE — ED Notes (Signed)
Patient alert asked if patient able to perform orthostatic vital signs stated "I can try" Tolerated without incident with minimal assistance.

## 2012-09-29 NOTE — ED Provider Notes (Signed)
History     CSN: [id number]  Arrival date & time 09/29/12  1432   First MD Initiated Contact with Patient 09/29/12 1508      Chief Complaint  Patient presents with  . Loss of Consciousness     Patient is a 79 y.o. female presenting with syncope. The history is provided by the EMS personnel, a caregiver and a relative. The history is limited by the condition of the patient (Uncooperative).  Loss of Consciousness    Pt was seen at 1515.   Per pt's family, pt c/o gradual onset and persistence of constantly "not feeling well" that began earlier today.  Pt's family states she was in the bathroom, had finished using the toilet and "then didn't respond for 10 minutes."  Pt did not fall.  Sternal rub applied to pt on arrival, with pt telling ED staff "I'm sick" and she "didn't want to talk."  Pt will not speak further with ED staff or family at bedside. Family denies pt has had any recent fevers, no N/V/D, no SOB/cough. Family states pt has hx of chronic abd pain that has been extensively evaluated by her PMD and GI MD, dx GERD.     Past Medical History  Diagnosis Date  . Hypertension   . High cholesterol   . GERD (gastroesophageal reflux disease)   . Hiatal hernia   . Diverticular disease     Past Surgical History  Procedure Laterality Date  . Abdominal hysterectomy      History  Substance Use Topics  . Smoking status: Never Smoker   . Smokeless tobacco: Not on file  . Alcohol Use: No    Review of Systems  Unable to perform ROS: Mental status change  Cardiovascular: Positive for syncope.      Allergies  Iodine  Home Medications   Current Outpatient Rx  Name  Route  Sig  Dispense  Refill  . amLODipine (NORVASC) 10 MG tablet   Oral   Take 10 mg by mouth daily.         Marland Kitchen aspirin 325 MG tablet   Oral   Take 325 mg by mouth daily.         Marland Kitchen atenolol (TENORMIN) 50 MG tablet   Oral   Take 50 mg by mouth daily.         Marland Kitchen atorvastatin (LIPITOR) 80 MG  tablet   Oral   Take 80 mg by mouth daily.         . calcium-vitamin D (OSCAL WITH D) 500-200 MG-UNIT per tablet   Oral   Take 1 tablet by mouth daily.         . cholecalciferol (VITAMIN D) 1000 UNITS tablet   Oral   Take 2,000 Units by mouth daily.         . Multiple Vitamin (MULITIVITAMIN WITH MINERALS) TABS   Oral   Take 1 tablet by mouth daily.         Marland Kitchen omega-3 acid ethyl esters (LOVAZA) 1 G capsule   Oral   Take 1 g by mouth 2 (two) times daily.         Marland Kitchen omeprazole (PRILOSEC) 20 MG capsule   Oral   Take 20 mg by mouth daily.         . valsartan (DIOVAN) 320 MG tablet   Oral   Take 320 mg by mouth daily.           BP 160/84  Pulse 67  Temp(Src)  97.5 F (36.4 C) (Oral)  Resp 15  SpO2 100%  Physical Exam 1520: Physical examination:  Nursing notes reviewed; Vital signs and O2 SAT reviewed;  Constitutional: Well developed, Well nourished, Well hydrated, In no acute distress; Head:  Normocephalic, atraumatic; Eyes: EOMI, PERRL, No scleral icterus; ENMT: Mouth and pharynx normal, Mucous membranes moist; Neck: Supple, Full range of motion, No lymphadenopathy; Cardiovascular: Regular rate and rhythm, No gallop; Respiratory: Breath sounds clear & equal bilaterally, No wheezes.  Speaking full sentences with ease, Normal respiratory effort/excursion; Chest: Nontender, Movement normal; Abdomen:  Pt moans with generalized palpation of her abd. Soft. Nondistended, Normal bowel sounds;; Extremities: Pulses normal, No tenderness, No edema, No calf edema or asymmetry.; Neuro: Awake, alert, holding her eyes closed and refuses to speak to ED staff, will only moan. Will spontaneously move all ext on stretcher without apparent gross focal motor deficits.; Skin: Color normal, Warm, Dry.   ED Course  Procedures    MDM  MDM Reviewed: nursing note, vitals and previous chart Reviewed previous: ECG and labs Interpretation: labs, ECG, x-ray and CT scan    Date:  09/29/2012  Rate: 65  Rhythm: normal sinus rhythm  QRS Axis: normal  Intervals: normal  ST/T Wave abnormalities: normal  Conduction Disutrbances:none  Narrative Interpretation: LVH  Old EKG Reviewed: unchanged; no significant changes from previous EKG dated 08/29/2012.   Results for orders placed during the hospital encounter of 09/29/12  GLUCOSE, CAPILLARY      Result Value Range   Glucose-Capillary 128 (*) 70 - 99 mg/dL   Comment 1 Notify RN     Comment 2 Documented in Chart    CBC WITH DIFFERENTIAL      Result Value Range   WBC 7.1  4.0 - 10.5 K/uL   RBC 5.08  3.87 - 5.11 MIL/uL   Hemoglobin 11.2 (*) 12.0 - 15.0 g/dL   HCT 86.5 (*) 78.4 - 69.6 %   MCV 68.5 (*) 78.0 - 100.0 fL   MCH 22.0 (*) 26.0 - 34.0 pg   MCHC 32.2  30.0 - 36.0 g/dL   RDW 29.5 (*) 28.4 - 13.2 %   Platelets 234  150 - 400 K/uL   Neutrophils Relative 62  43 - 77 %   Neutro Abs 4.4  1.7 - 7.7 K/uL   Lymphocytes Relative 31  12 - 46 %   Lymphs Abs 2.2  0.7 - 4.0 K/uL   Monocytes Relative 6  3 - 12 %   Monocytes Absolute 0.4  0.1 - 1.0 K/uL   Eosinophils Relative 1  0 - 5 %   Eosinophils Absolute 0.1  0.0 - 0.7 K/uL   Basophils Relative 0  0 - 1 %   Basophils Absolute 0.0  0.0 - 0.1 K/uL   RBC Morphology POLYCHROMASIA PRESENT     Smear Review LARGE PLATELETS PRESENT    COMPREHENSIVE METABOLIC PANEL      Result Value Range   Sodium 140  135 - 145 mEq/L   Potassium 3.0 (*) 3.5 - 5.1 mEq/L   Chloride 103  96 - 112 mEq/L   CO2 24  19 - 32 mEq/L   Glucose, Bld 126 (*) 70 - 99 mg/dL   BUN 14  6 - 23 mg/dL   Creatinine, Ser 4.40  0.50 - 1.10 mg/dL   Calcium 9.7  8.4 - 10.2 mg/dL   Total Protein 7.6  6.0 - 8.3 g/dL   Albumin 3.5  3.5 - 5.2 g/dL   AST  20  0 - 37 U/L   ALT 11  0 - 35 U/L   Alkaline Phosphatase 81  39 - 117 U/L   Total Bilirubin 0.3  0.3 - 1.2 mg/dL   GFR calc non Af Amer 52 (*) >90 mL/min   GFR calc Af Amer 60 (*) >90 mL/min  URINALYSIS, ROUTINE W REFLEX MICROSCOPIC      Result Value  Range   Color, Urine YELLOW  YELLOW   APPearance CLEAR  CLEAR   Specific Gravity, Urine 1.015  1.005 - 1.030   pH 7.0  5.0 - 8.0   Glucose, UA NEGATIVE  NEGATIVE mg/dL   Hgb urine dipstick NEGATIVE  NEGATIVE   Bilirubin Urine NEGATIVE  NEGATIVE   Ketones, ur NEGATIVE  NEGATIVE mg/dL   Protein, ur NEGATIVE  NEGATIVE mg/dL   Urobilinogen, UA 0.2  0.0 - 1.0 mg/dL   Nitrite NEGATIVE  NEGATIVE   Leukocytes, UA NEGATIVE  NEGATIVE  LIPASE, BLOOD      Result Value Range   Lipase 51  11 - 59 U/L  LACTIC ACID, PLASMA      Result Value Range   Lactic Acid, Venous 2.4 (*) 0.5 - 2.2 mmol/L  POCT I-STAT, CHEM 8      Result Value Range   Sodium 144  135 - 145 mEq/L   Potassium 2.9 (*) 3.5 - 5.1 mEq/L   Chloride 106  96 - 112 mEq/L   BUN 13  6 - 23 mg/dL   Creatinine, Ser 1.61  0.50 - 1.10 mg/dL   Glucose, Bld 096 (*) 70 - 99 mg/dL   Calcium, Ion 0.45  4.09 - 1.30 mmol/L   TCO2 25  0 - 100 mmol/L   Hemoglobin 12.6  12.0 - 15.0 g/dL   HCT 81.1  91.4 - 78.2 %  POCT I-STAT TROPONIN I      Result Value Range   Troponin i, poc 0.01  0.00 - 0.08 ng/mL   Comment 3            Dg Chest 2 View 09/29/2012  *RADIOLOGY REPORT*  Clinical Data: Loss of consciousness.  CHEST - 2 VIEW  Comparison: 08/29/2012.  Findings: Low volume chest.  Basilar atelectasis, greater on the left than right.  The appearance is similar to the prior exam last month.  There is no consolidation.  Exaggerated thoracic kyphosis.  Thoracic spine DISH.  Patient is rotated to the left.  Probable bone infarct in the proximal left humerus appears similar to prior.  IMPRESSION: Low volume chest.  No interval change compared to prior.   Original Report Authenticated By: Andreas Newport, M.D.    Ct Head Wo Contrast 09/29/2012  *RADIOLOGY REPORT*  Clinical Data: Syncope  CT HEAD WITHOUT CONTRAST  Technique:  Contiguous axial images were obtained from the base of the skull through the vertex without contrast.  Comparison: 08/29/2012  Findings:  There is no evidence for acute hemorrhage, hydrocephalus, mass lesion, or abnormal extra-axial fluid collection.  No definite CT evidence for acute infarction.  Diffuse loss of parenchymal volume is consistent with atrophy. Patchy low attenuation in the deep hemispheric and periventricular white matter is nonspecific, but likely reflects chronic microvascular ischemic demyelination.  The visualized paranasal sinuses and mastoid air cells are clear.  IMPRESSION: Stable.  No acute intracranial abnormality.  Atrophy with chronic small vessel white matter disease.   Original Report Authenticated By: Kennith Center, M.D.     1900:  Pt continues uncooperative with staff, laying  with her eyes closed, refusing to speak. Refuses to cooperate with orthostatic VS. Appears NAD, resps easy, moves all ext well.  Has long hx of chronic abd pain per family, no focal findings on abd exam today so will defer imaging at this time.  Will replete potassium IV. Dx and testing d/w pt and family.  Questions answered.  Verb understanding, agreeable to admit. T/C to [facility] Dr. Link Snuffer, case discussed, including:  HPI, pertinent PM/SHx, VS/PE, dx testing, ED course and treatment:  Agreeable to admit, requests to obtain MRI brain, he will come to ED for eval.             Laray Anger, DO 10/01/12 [phone]

## 2012-09-29 NOTE — ED Notes (Addendum)
Report per daughter. Pt was apparently trying to use the bathroom when she had a syncopal episode for approx 10 minutes. The pt did not fall off the toilet. Once here, pt states that her head hurt, but would not rate the pain. Pt is unwilling to speak. This RN performed a slight sternal rub to make the pt pay attention, otherwise the pt states "I'm just sick, I don't wanna talk".

## 2012-09-30 DIAGNOSIS — I10 Essential (primary) hypertension: Secondary | ICD-10-CM

## 2012-09-30 DIAGNOSIS — R55 Syncope and collapse: Secondary | ICD-10-CM

## 2012-09-30 DIAGNOSIS — D649 Anemia, unspecified: Secondary | ICD-10-CM

## 2012-09-30 DIAGNOSIS — I517 Cardiomegaly: Secondary | ICD-10-CM

## 2012-09-30 DIAGNOSIS — E785 Hyperlipidemia, unspecified: Secondary | ICD-10-CM

## 2012-09-30 DIAGNOSIS — G459 Transient cerebral ischemic attack, unspecified: Secondary | ICD-10-CM

## 2012-09-30 LAB — URINE CULTURE: Culture: NO GROWTH

## 2012-09-30 LAB — CBC
HCT: 29.4 % — ABNORMAL LOW (ref 36.0–46.0)
MCH: 21.9 pg — ABNORMAL LOW (ref 26.0–34.0)
MCHC: 32.7 g/dL (ref 30.0–36.0)
MCV: 67 fL — ABNORMAL LOW (ref 78.0–100.0)
Platelets: 241 10*3/uL (ref 150–400)
RDW: 16 % — ABNORMAL HIGH (ref 11.5–15.5)
WBC: 6.7 10*3/uL (ref 4.0–10.5)

## 2012-09-30 LAB — BASIC METABOLIC PANEL
BUN: 10 mg/dL (ref 6–23)
CO2: 26 mEq/L (ref 19–32)
Calcium: 9 mg/dL (ref 8.4–10.5)
Chloride: 107 mEq/L (ref 96–112)
Creatinine, Ser: 0.84 mg/dL (ref 0.50–1.10)
Glucose, Bld: 89 mg/dL (ref 70–99)

## 2012-09-30 LAB — TSH: TSH: 1.631 u[IU]/mL (ref 0.350–4.500)

## 2012-09-30 NOTE — Evaluation (Signed)
Physical Therapy Evaluation Patient Details Name: Lisamarie Coke MRN: [id number] DOB: 1924/12/30 Today's Date: 09/30/2012 Time: 4098-1191 PT Time Calculation (min): 18 min  PT Assessment / Plan / Recommendation Clinical Impression  Patient admitted after syncopal episode, probably dehydration per chart.  Patient with no apparent mobility or balance deficits.  No further PT needs identified, will sign off.    PT Assessment  Patent does not need any further PT services    Follow Up Recommendations  No PT follow up    Does the patient have the potential to tolerate intense rehabilitation      Barriers to Discharge        Equipment Recommendations  None recommended by PT    Recommendations for Other Services     Frequency      Precautions / Restrictions Precautions Precautions: Fall Restrictions Weight Bearing Restrictions: No   Pertinent Vitals/Pain No pain, BP high, but no significant change between sitting and standing.      Mobility  Bed Mobility Bed Mobility: Supine to Sit;Sit to Supine Supine to Sit: 7: Independent Sit to Supine: 7: Independent Transfers Transfers: Sit to Stand;Stand to Sit Sit to Stand: 7: Independent Stand to Sit: 7: Independent Ambulation/Gait Ambulation/Gait Assistance: 7: Independent Ambulation Distance (Feet): 200 Feet Assistive device: None Gait Pattern: Within Functional Limits    Exercises     PT Diagnosis:    PT Problem List:   PT Treatment Interventions:     PT Goals    Visit Information  Last PT Received On: 09/30/12 Assistance Needed: +1    Subjective Data  Subjective: I can take care of myself, I get around just fine Patient Stated Goal: I am not doing anything for Easter dinner   Prior Functioning  Home Living Lives With: Family Available Help at Discharge: Family Type of Home: House Home Access: Stairs to enter Secretary/administrator of Steps: 5-7 Entrance Stairs-Rails: Right Home Layout: One level Home  Adaptive Equipment: None Prior Function Level of Independence: Independent Able to Take Stairs?: Yes Communication Communication: No difficulties    Cognition  Cognition Arousal/Alertness: Awake/alert Behavior During Therapy: WFL for tasks assessed/performed Overall Cognitive Status: Within Functional Limits for tasks assessed    Extremity/Trunk Assessment Right Upper Extremity Assessment RUE ROM/Strength/Tone: [facility] for tasks assessed Left Upper Extremity Assessment LUE ROM/Strength/Tone: [facility] for tasks assessed Right Lower Extremity Assessment RLE ROM/Strength/Tone: [facility] for tasks assessed Left Lower Extremity Assessment LLE ROM/Strength/Tone: [facility] for tasks assessed Trunk Assessment Trunk Assessment: Normal   Balance Balance Balance Assessed:  (no apparent deficits)  End of Session PT - End of Session Activity Tolerance: Patient tolerated treatment well Patient left: in bed;with call bell/phone within reach  GP     Olivia Canter, [place] [phone] 09/30/2012, 3:58 PM

## 2012-09-30 NOTE — Progress Notes (Signed)
Physician Daily Progress Note  Subjective: Had bradycardia, asymptomatic, into the 40s overnight. Otherwise no events. Tolerating PO. No emesis.   Objective: Vital signs in last 24 hours: Temp:  [97.1 F (36.2 C)-98.7 F (37.1 C)] 98.7 F (37.1 C) (04/17 0550) Pulse Rate:  [48-85] 48 (04/17 0550) Resp:  [11-20] 16 (04/17 0550) BP: (140-201)/(58-105) 147/58 mmHg (04/17 0550) SpO2:  [97 %-100 %] 100 % (04/17 0550) Weight:  [144 lb 4.8 oz (65.454 kg)-145 lb (65.772 kg)] 145 lb (65.772 kg) (04/17 0550) Weight change:  Last BM Date: 09/29/12  CBG (last 3)   Recent Labs  09/29/12 1444 09/29/12 2235  GLUCAP 128* 109*    Intake/Output from previous day:  Intake/Output Summary (Last 24 hours) at 09/30/12 0751 Last data filed at 09/30/12 0559  Gross per 24 hour  Intake 978.33 ml  Output   1100 ml  Net -121.67 ml   04/16 0701 - 04/17 0700 In: 978.3 [P.O.:480; I.V.:298.3; IV Piggyback:200] Out: 1100 [Urine:1100]  Physical Exam Gen: AAF in NAD  HEENT: Trachea midline, dry MM, EOMI  Cardio: RRR, no MRG  Lungs: CTAB, no wheezes or rales  Abd: nontender, nondistended, normal BS  MSK: 5/5 strength in UE and LE B/L, normal reflexes, normal sensation  Neuro: CN II-XII grossly intact , no focal deficits  Psych: no signs of agitation or anxiety     Lab Results:  Recent Labs  09/29/12 1450 09/29/12 1502 09/30/12 0540  NA 140 144 139  K 3.0* 2.9* 4.3  CL 103 106 107  CO2 24  --  26  GLUCOSE 126* 132* 89  BUN 14 13 10   CREATININE 0.97 0.90 0.84  CALCIUM 9.7  --  9.0     Recent Labs  09/29/12 1450  AST 20  ALT 11  ALKPHOS 81  BILITOT 0.3  PROT 7.6  ALBUMIN 3.5     Recent Labs  09/29/12 1450 09/29/12 1502 09/30/12 0540  WBC 7.1  --  6.7  NEUTROABS 4.4  --   --   HGB 11.2* 12.6 9.6*  HCT 34.8* 37.0 29.4*  MCV 68.5*  --  67.0*  PLT 234  --  241    No results found for this basename: INR, PROTIME    No results found for this basename: CKTOTAL,  CKMB, CKMBINDEX, TROPONINI,  in the last 72 hours   Recent Labs  09/29/12 1905  TSH 1.631    No results found for this basename: VITAMINB12, FOLATE, FERRITIN, TIBC, IRON, RETICCTPCT,  in the last 72 hours  Micro Results: No results found for this or any previous visit (from the past 240 hour(s)).  Studies/Results: Dg Chest 2 View  09/29/2012  *RADIOLOGY REPORT*  Clinical Data: Loss of consciousness.  CHEST - 2 VIEW  Comparison: 08/29/2012.  Findings: Low volume chest.  Basilar atelectasis, greater on the left than right.  The appearance is similar to the prior exam last month.  There is no consolidation.  Exaggerated thoracic kyphosis.  Thoracic spine DISH.  Patient is rotated to the left.  Probable bone infarct in the proximal left humerus appears similar to prior.  IMPRESSION: Low volume chest.  No interval change compared to prior.   Original Report Authenticated By: Andreas Newport, M.D.    Ct Head Wo Contrast  09/29/2012  *RADIOLOGY REPORT*  Clinical Data: Syncope  CT HEAD WITHOUT CONTRAST  Technique:  Contiguous axial images were obtained from the base of the skull through the vertex without contrast.  Comparison: 08/29/2012  Findings: There is no evidence for acute hemorrhage, hydrocephalus, mass lesion, or abnormal extra-axial fluid collection.  No definite CT evidence for acute infarction.  Diffuse loss of parenchymal volume is consistent with atrophy. Patchy low attenuation in the deep hemispheric and periventricular white matter is nonspecific, but likely reflects chronic microvascular ischemic demyelination.  The visualized paranasal sinuses and mastoid air cells are clear.  IMPRESSION: Stable.  No acute intracranial abnormality.  Atrophy with chronic small vessel white matter disease.   Original Report Authenticated By: Kennith Center, M.D.      Medications: Scheduled: . amLODipine  10 mg Oral Daily  . aspirin  325 mg Oral Daily  . atorvastatin  80 mg Oral Daily  .  calcium-vitamin D  1 tablet Oral Daily  . cholecalciferol  2,000 Units Oral Daily  . enoxaparin (LOVENOX) injection  40 mg Subcutaneous Q24H  . irbesartan  300 mg Oral Daily  . multivitamin with minerals  1 tablet Oral Daily  . omega-3 acid ethyl esters  1 g Oral BID  . pantoprazole  40 mg Oral Daily  . sodium chloride  3 mL Intravenous Q12H   Continuous: . sodium chloride 100 mL/hr at 09/30/12 0300    Assessment/Plan: Principal Problem:   Syncope - the cause is arrythmia vs orthostatis vs neurogenic (in setting of BM). - Now s/p hydration w/ NS at 100cc/hr x 12 hours.. Orthostatic vitals were normal in ED, although after receiving IVF. - Given no focal deficits or other stroke-like symptoms, defered on MRI brain in setting of normal CT head.  -TSH WNL.  -ordered TTE, CUS today to ensure not cardiac structural defect and r/o carotid stenosis.  - on tele she showed sinus brady w/o arrythmia overnight. - Given bradycardia, will monitor off BB x 24 hours to see if resolution occurs, if not may need cardiology consult in AM.   Active Problems:  Bradycardia - while atenolol was on her list of meds in centricity, it is unclear if she was taking this medication prior to arrival (states she was on 4 meds + ASA 325, and 4 meds can be accounted for w/o the atenolol), which would explain why she experienced bradycardia when given the atenolol last night. Have d/c'd this medication and will monitor x 24 hours for resolution.  Hypertension - continue home meds, except atenolol d/c'd for bradycardia . IV hydralazine prn  Hyperlipidemia - cont home meds  Anemia - cont PPI given has hx of gastritis. This is chronic medical condition. Hgb did drop overnight in setting of hydration. Will check daily CBC, xfuse for Hgb < 7 Chronic abdominal pain - tylenol prn pain + PPI and home meds  Hypokalemia - normalized w/ replacement PPx - lovenox and PPI  Dispo - d/c home ? Tomorrow ? pending resolution of  bradycardia and results of TTE and CUS    LOS: 1 day   Taylar Hartsough 09/30/2012, 7:51 AM

## 2012-09-30 NOTE — Progress Notes (Addendum)
Pt HR sustaining in 40s. Pt Hr came up to 53 when awake, immediately dropped back to 40s. Pt asymptomatic and was sleeping. BP 145/63, O2 sat 100% on RA. Pt received atenolol PO this PM on admission and stated she hadn't taken any of her normal meds on 4/16. Pt also stated that she does not have a hx of a low HR. MD notified and no new orders given. Will continue to monitor pt HR. Baron Hamper, RN 09/30/2012 1:58 AM

## 2012-09-30 NOTE — Progress Notes (Signed)
  Echocardiogram 2D Echocardiogram has been performed.  Terryn Rosenkranz 09/30/2012, 9:58 AM

## 2012-09-30 NOTE — Progress Notes (Signed)
Brief Nutrition Note:  RD pulled to pt for malnutrition screening tool report of poor appetite and unintentional weight loss.  Pt states that her appetite is normal for her. Endorses about 9 lb weight loss over 6 months. Weight hx shows 5 lbs in about 1 year, not significant weight loss. Pt was waiting on her daughter to bring her dentures to eat lunch at time of RD visit.  Wt Readings from Last 5 Encounters:  09/30/12 145 lb (65.772 kg)  10/29/11 150 lb 12.8 oz (68.402 kg)   Body mass index is 25.69 kg/(m^2). Overweight.  Diet: Heart Healthy   Chart reviewed, no nutrition interventions warranted at this time. Please Consult as needed.   Clarene Duke RD, LDN Pager [phone] After Hours pager [phone]

## 2012-09-30 NOTE — Progress Notes (Signed)
Utilization Review Completed Adaleen Hulgan J. Aletheia Tangredi, RN, BSN, NCM [phone]  

## 2012-09-30 NOTE — Progress Notes (Signed)
VASCULAR LAB PRELIMINARY  PRELIMINARY  PRELIMINARY  PRELIMINARY  Carotid duplex completed.    Preliminary report:  Bilateral:  No evidence of hemodynamically significant internal carotid artery stenosis.   Vertebral artery flow is antegrade.     Simranjit Thayer, RVS 09/30/2012, 9:34 AM

## 2012-10-01 LAB — BASIC METABOLIC PANEL
CO2: 26 mEq/L (ref 19–32)
Calcium: 8.9 mg/dL (ref 8.4–10.5)
Chloride: 106 mEq/L (ref 96–112)
Creatinine, Ser: 0.95 mg/dL (ref 0.50–1.10)
Glucose, Bld: 93 mg/dL (ref 70–99)

## 2012-10-01 LAB — CBC
HCT: 30.8 % — ABNORMAL LOW (ref 36.0–46.0)
MCH: 22.2 pg — ABNORMAL LOW (ref 26.0–34.0)
MCV: 67.7 fL — ABNORMAL LOW (ref 78.0–100.0)
Platelets: 234 10*3/uL (ref 150–400)
RDW: 15.9 % — ABNORMAL HIGH (ref 11.5–15.5)
WBC: 6 10*3/uL (ref 4.0–10.5)

## 2012-10-01 MED ORDER — HYDRALAZINE HCL 25 MG PO TABS: 25.0000 mg | ORAL_TABLET | Freq: Two times a day (BID) | ORAL | Status: DC

## 2012-10-01 NOTE — Progress Notes (Signed)
Pt given DC instructions and verbalized understanding.  Pt DC home via wc.  

## 2012-10-01 NOTE — Discharge Summary (Signed)
DISCHARGE SUMMARY  [name]  MR#: [id number]  DOB:1924/09/15  Date of Admission: 09/29/2012 Date of Discharge: 10/01/2012  Attending Physician:[name]  Patient's WUJ:WJXBJ,YNWG M, MD  Consults: none  Discharge Diagnoses: Principal Problem:   Syncope Active Problems:   Hypertension   Hyperlipidemia   Anemia   Chronic abdominal pain   Discharge Medications:   Medication List    STOP taking these medications       atenolol 50 MG tablet  Commonly known as:  TENORMIN      TAKE these medications       amLODipine 10 MG tablet  Commonly known as:  NORVASC  Take 10 mg by mouth daily.     aspirin 325 MG tablet  Take 325 mg by mouth daily.     atorvastatin 80 MG tablet  Commonly known as:  LIPITOR  Take 80 mg by mouth daily.     calcium-vitamin D 500-200 MG-UNIT per tablet  Commonly known as:  OSCAL WITH D  Take 1 tablet by mouth daily.     cholecalciferol 1000 UNITS tablet  Commonly known as:  VITAMIN D  Take 2,000 Units by mouth daily.     hydrALAZINE 25 MG tablet  Commonly known as:  APRESOLINE  Take 1 tablet (25 mg total) by mouth 2 (two) times daily.     multivitamin with minerals Tabs  Take 1 tablet by mouth daily.     omega-3 acid ethyl esters 1 G capsule  Commonly known as:  LOVAZA  Take 1 g by mouth 2 (two) times daily.     omeprazole 20 MG capsule  Commonly known as:  PRILOSEC  Take 20 mg by mouth daily.     valsartan 320 MG tablet  Commonly known as:  DIOVAN  Take 320 mg by mouth daily.        Hospital Procedures: Dg Chest 2 View  09/29/2012  *RADIOLOGY REPORT*  Clinical Data: Loss of consciousness.  CHEST - 2 VIEW  Comparison: 08/29/2012.  Findings: Low volume chest.  Basilar atelectasis, greater on the left than right.  The appearance is similar to the prior exam last month.  There is no consolidation.  Exaggerated thoracic kyphosis.  Thoracic spine DISH.  Patient is rotated to the left.  Probable bone infarct in the proximal  left humerus appears similar to prior.  IMPRESSION: Low volume chest.  No interval change compared to prior.   Original Report Authenticated By: [name], M.D.    Ct Head Wo Contrast  09/29/2012  *RADIOLOGY REPORT*  Clinical Data: Syncope  CT HEAD WITHOUT CONTRAST  Technique:  Contiguous axial images were obtained from the base of the skull through the vertex without contrast.  Comparison: 08/29/2012  Findings: There is no evidence for acute hemorrhage, hydrocephalus, mass lesion, or abnormal extra-axial fluid collection.  No definite CT evidence for acute infarction.  Diffuse loss of parenchymal volume is consistent with atrophy. Patchy low attenuation in the deep hemispheric and periventricular white matter is nonspecific, but likely reflects chronic microvascular ischemic demyelination.  The visualized paranasal sinuses and mastoid air cells are clear.  IMPRESSION: Stable.  No acute intracranial abnormality.  Atrophy with chronic small vessel white matter disease.   Original Report Authenticated By: [name], M.D.            2D Echocardiogram without contrast     Ordering Physician: Madison Penna, MD  Order# [id number] Study Date: 09/30/12      Patient Information     Name  MRN  Description    [name] [id number]  79 year old Female             Result Narrative    *[place]* *[facility]* [address] [place], [place] [address] [phone]  ------------------------------------------------------------ Transthoracic Echocardiography  Patient: Madison, Proctor MR #: [id number] Study Date: 09/30/2012 Gender: F Age: 12 Height: 160cm Weight: 65.8kg BSA: 1.27m^2 Pt. Status: Room: A11C  PERFORMING [name], [facility] ATTENDING Gwen Pounds SONOGRAPHER [address], RDCS ADMITTING [name], [name] ORDERING [place], [name] cc:  ------------------------------------------------------------ LV EF: 55% -  60%  ------------------------------------------------------------ Indications: Syncope 780.2.  ------------------------------------------------------------ History: Risk factors: Hypertension. Dyslipidemia.  ------------------------------------------------------------ Study Conclusions  - Left ventricle: The cavity size was normal. Wall thickness was normal. Systolic function was normal. The estimated ejection fraction was in the range of 55% to 60%. Wall motion was normal; there were no regional wall motion abnormalities. Doppler parameters are consistent with abnormal left ventricular relaxation (grade 1 diastolic dysfunction). - Aortic valve: There was no stenosis. Trivial regurgitation. - Mitral valve: Mildly calcified annulus. Trivial regurgitation. - Left atrium: The atrium was mildly dilated. - Right ventricle: The cavity size was normal. Systolic function was normal. - Right atrium: The atrium was mildly dilated. - Tricuspid valve: Peak RV-RA gradient: 16mm Hg (S). - Pulmonary arteries: PA peak pressure: 21mm Hg (S). - Inferior vena cava: The vessel was normal in size; the respirophasic diameter changes were in the normal range (= 50%); findings are consistent with normal central venous pressure. Impressions:  - Normal LV size and systolic function, EF 55-60%. Normal RV size and systolic function. No significant valvular abnormalities. Biatrial enlargement. Transthoracic echocardiography. M-mode, complete 2D, spectral Doppler, and color Doppler. Height: Height: 160cm. Height: 63in. Weight: Weight: 65.8kg. Weight: 144.8lb. Body mass index: BMI: 25.7kg/m^2. Body surface area: BSA: 1.29m^2. Blood pressure: 147/58. Patient status: Inpatient. Location: Echo laboratory.  ------------------------------------------------------------  ------------------------------------------------------------ Left ventricle: The cavity size was normal. Wall thickness was normal.  Systolic function was normal. The estimated ejection fraction was in the range of 55% to 60%. Wall motion was normal; there were no regional wall motion abnormalities. Doppler parameters are consistent with abnormal left ventricular relaxation (grade 1 diastolic dysfunction).  ------------------------------------------------------------ Aortic valve: Trileaflet; mildly calcified leaflets. Doppler: There was no stenosis. Trivial regurgitation.  ------------------------------------------------------------ Aorta: Aortic root: The aortic root was normal in size. Ascending aorta: The ascending aorta was normal in size.  ------------------------------------------------------------ Mitral valve: Mildly calcified annulus. Doppler: There was no evidence for stenosis. Trivial regurgitation. Peak gradient: 3mm Hg (D).  ------------------------------------------------------------ Left atrium: The atrium was mildly dilated.  ------------------------------------------------------------ Right ventricle: The cavity size was normal. Systolic function was normal.  ------------------------------------------------------------ Pulmonic valve: Structurally normal valve. Cusp separation was normal. Doppler: Transvalvular velocity was within the normal range. Trivial regurgitation.  ------------------------------------------------------------ Tricuspid valve: Doppler: There was no evidence for stenosis. Trivial regurgitation.  ------------------------------------------------------------ Right atrium: The atrium was mildly dilated.  ------------------------------------------------------------ Pericardium: There was no pericardial effusion.  ------------------------------------------------------------ Systemic veins: Inferior vena cava: The vessel was normal in size; the respirophasic diameter changes were in the normal range (= 50%); findings are consistent with normal central  venous pressure.  ------------------------------------------------------------  2D measurements Normal Doppler Normal Left ventricle measurements LVID ED, 46 mm 43-52 Main pulmonary chord, artery PLAX Pressure, S 21 mm =30 LVID ES, 34 mm 23-38 Hg chord, Left ventricle PLAX Ea, lat 7.21 cm/ ------- FS, chord, 26 % >29 ann, tiss s PLAX DP LVPW, ED 11 mm ------ E/Ea, lat  11.43 ------- IVS/LVPW 1 <1.3 ann, tiss ratio, ED DP Ventricular septum Ea, med 6.82 cm/ ------- IVS, ED 11 mm ------ ann, tiss s Aorta DP Root diam, 27 mm ------ E/Ea, med 12.08 ------- ED ann, tiss Left atrium DP AP dim 2.26 cm/m^2 <2.2 Aortic valve index Regurg PHT 1057 ms ------- Mitral valve Peak E vel 82.4 cm/ ------- s Peak Proctor vel 79.5 cm/ ------- s Deceleratio 236 ms 150-230 n time Peak 3 mm ------- gradient, D Hg Peak E/Proctor 1 ------- ratio Tricuspid valve Regurg peak 200 cm/ ------- vel s Peak RV-RA 16 mm ------- gradient, S Hg Right ventricle Sa vel, lat 9.85 cm/ ------- ann, tiss s DP  ------------------------------------------------------------ Prepared and Electronically Authenticated by  Marca Ancona 2014-04-17T14:02:38.943               Wall Scoring                                              Printable Result Report    Result Report                  2D Echocardiogram without contrast (Order [id number]) Released By: Auto-released Date: 09/29/2012  Echocardiography Authorizing: Madison Penna, MD Department: [facility] [address]  Order: [id number]              Order Information    Order Date/Time Release Date/Time Start Date/Time End Date/Time   09/29/2012 7:19 PM 09/29/2012 7:19 PM 09/29/2012 7:19 PM 09/29/2012 7:19 PM         Order Details    Frequency Duration Priority Order Class   Once 1 occurrence STAT Hospital Performed         Imaging CC Recipients                     Collection  Information    Collection Date Collection Time Resulting Agency   09/30/2012 9:29 AM [facility]            Order Provider Info      Office phone Pager/beeper E-mail   Ordering User Madison Penna, MD [phone] --   Authorizing Provider Madison Penna, MD [phone] --   Attending Provider Gwen Pounds, MD [phone] -- --            Reprint Requisition    2D Echocardiogram without contrast (Order #[id number]) on 09/29/12         Original Order    Ordered On Ordered By     Wed Sep 29, 2012 7:19 PM Madison Penna, MD              History of Present Illness:  [name] is an 79 y.o. female. Pt of Dr Ferd Hibbs w/ HTN, HLD, chronic abd pain, and chronic anemia who was noted to be found by her family unresponsive on the toilet for about 10 minutes. This was after having Proctor BM. No focal deficits can be elicited. Did not appear to have prodromal or postictal state. She does not recall the event.  In the ED, her CT head showed NAICA, CXR was unchanged, EKG showed normal intervals and no acute changes, lactate mildly elevated at 2.4, mild hypokalemia (that was replaced in ED), stable anemia, clean U/Proctor and otherwise unremarkable. Orthostatics normal. She will be admitted for syncope w/u.  Per family, this is  how she gets "whenever she is sick".   Hospital Course: Patient was admitted for monitoring following Proctor syncopal episode after fairly exhausting period of time and being on the commode. She had no prodrome and no sequela I as she was back to normal upon arrival in the emergency room. Her rhythm remained stable beyond Proctor sinus bradycardia with regards to which her atenolol has been held. Carotid Dopplers and echocardiogram were unremarkable. She was back to baseline ambulating eating with no complaints. Neurologically she is remain stable. History was most compatible with Proctor vasovagal episode and she is discharged in improved and stable condition. In  place of her beta blocker she has had hydralazine added to her regimen. She was instructed on close followup with Dr. Timothy Lasso next week.  Day of Discharge Exam BP 162/66  Pulse 49  Temp(Src) 98.6 F (37 C) (Oral)  Resp 18  Ht 5\' 3"  (1.6 m)  Wt 65.046 kg (143 lb 6.4 oz)  BMI 25.41 kg/m2  SpO2 98%  Physical Exam: General appearance: alert, cooperative and no distress Eyes: no scleral icterus Throat: oropharynx moist without erythema Resp: clear to auscultation bilaterally Cardio: regular rate and rhythm Extremities: no clubbing, cyanosis or edema Neurologically higher cortical functioning is intact and her complete exam is normal.  Discharge Labs:  Recent Labs  09/30/12 0540 10/01/12 0515  NA 139 139  K 4.3 3.5  CL 107 106  CO2 26 26  GLUCOSE 89 93  BUN 10 11  CREATININE 0.84 0.95  CALCIUM 9.0 8.9    Recent Labs  09/29/12 1450  AST 20  ALT 11  ALKPHOS 81  BILITOT 0.3  PROT 7.6  ALBUMIN 3.5    Recent Labs  09/29/12 1450  09/30/12 0540 10/01/12 0515  WBC 7.1  --  6.7 6.0  NEUTROABS 4.4  --   --   --   HGB 11.2*  < > 9.6* 10.1*  HCT 34.8*  < > 29.4* 30.8*  MCV 68.5*  --  67.0* 67.7*  PLT 234  --  241 234  < > = values in this interval not displayed. No results found for this basename: CKTOTAL, CKMB, CKMBINDEX, TROPONINI,  in the last 72 hours  Recent Labs  09/29/12 1905  TSH 1.631   No results found for this basename: VITAMINB12, FOLATE, FERRITIN, TIBC, IRON, RETICCTPCT,  in the last 72 hours  Discharge instructions:     Discharge Orders   Future Orders Complete By Expires     Diet - low sodium heart healthy  As directed     Increase activity slowly  As directed        Disposition: Home  Follow-up Appts: Follow-up with Dr. Jacky Kindle at [facility] in next week with Dr. Timothy Lasso.  Call for appointment.  Condition on Discharge: Improved and stable condition  Tests Needing Follow-up: None  Signed: Dawne Casali  Proctor 10/01/2012, 11:55 AM

## 2013-01-20 ENCOUNTER — Encounter (HOSPITAL_COMMUNITY): Payer: Self-pay | Admitting: Emergency Medicine

## 2013-01-20 ENCOUNTER — Emergency Department (HOSPITAL_COMMUNITY)
Admission: EM | Admit: 2013-01-20 | Discharge: 2013-01-20 | Disposition: A | Discharge status: Home / Self Care | Payer: Medicare Other | Attending: Emergency Medicine | Admitting: Emergency Medicine

## 2013-01-20 DIAGNOSIS — IMO0002 Reserved for concepts with insufficient information to code with codable children: Secondary | ICD-10-CM | POA: Insufficient documentation

## 2013-01-20 DIAGNOSIS — Z8709 Personal history of other diseases of the respiratory system: Secondary | ICD-10-CM | POA: Insufficient documentation

## 2013-01-20 DIAGNOSIS — Y929 Unspecified place or not applicable: Secondary | ICD-10-CM | POA: Insufficient documentation

## 2013-01-20 DIAGNOSIS — K219 Gastro-esophageal reflux disease without esophagitis: Secondary | ICD-10-CM | POA: Insufficient documentation

## 2013-01-20 DIAGNOSIS — Z79899 Other long term (current) drug therapy: Secondary | ICD-10-CM | POA: Insufficient documentation

## 2013-01-20 DIAGNOSIS — Y93H2 Activity, gardening and landscaping: Secondary | ICD-10-CM | POA: Insufficient documentation

## 2013-01-20 DIAGNOSIS — Z8719 Personal history of other diseases of the digestive system: Secondary | ICD-10-CM | POA: Insufficient documentation

## 2013-01-20 DIAGNOSIS — Z7982 Long term (current) use of aspirin: Secondary | ICD-10-CM | POA: Insufficient documentation

## 2013-01-20 DIAGNOSIS — S50811A Abrasion of right forearm, initial encounter: Secondary | ICD-10-CM

## 2013-01-20 DIAGNOSIS — E7800 Pure hypercholesterolemia, unspecified: Secondary | ICD-10-CM | POA: Insufficient documentation

## 2013-01-20 DIAGNOSIS — I10 Essential (primary) hypertension: Secondary | ICD-10-CM | POA: Insufficient documentation

## 2013-01-20 NOTE — ED Provider Notes (Signed)
CSN: [id number]     Arrival date & time 01/20/13  1210 History     First MD Initiated Contact with Patient 01/20/13 1216     Chief Complaint  Patient presents with  . Insect Bite   (Consider location/radiation/quality/duration/timing/severity/associated sxs/prior Treatment) HPI [name] is a 79 y.o. female who presents to ED with complaint of a skin wound to the left forearm. States that was doing gardening 5 days ago and felt something stinging on left forearm. States since then had a "blister to that area." States yesterday put peroxide on it and blister opened up. Denies any pain or swelling to the area. No drainage. No induration. No fever, chills malaise. No other complaints.   Past Medical History  Diagnosis Date  . Hypertension   . High cholesterol   . GERD (gastroesophageal reflux disease)   . Hiatal hernia   . Diverticular disease    Past Surgical History  Procedure Laterality Date  . Abdominal hysterectomy     No family history on file. History  Substance Use Topics  . Smoking status: Never Smoker   . Smokeless tobacco: Not on file  . Alcohol Use: No   OB History   Grav Para Term Preterm Abortions TAB SAB Ect Mult Living                 Review of Systems  Constitutional: Negative for fever and chills.  HENT: Negative for neck pain and neck stiffness.   Respiratory: Negative.   Cardiovascular: Negative.   Genitourinary: Negative for dysuria and flank pain.  Skin: Positive for wound.    Allergies  Iodine  Home Medications   Current Outpatient Rx  Name  Route  Sig  Dispense  Refill  . amLODipine (NORVASC) 10 MG tablet   Oral   Take 10 mg by mouth daily.         Marland Kitchen aspirin 325 MG tablet   Oral   Take 325 mg by mouth daily.         Marland Kitchen atorvastatin (LIPITOR) 80 MG tablet   Oral   Take 80 mg by mouth daily.         . calcium-vitamin D (OSCAL WITH D) 500-200 MG-UNIT per tablet   Oral   Take 1 tablet by mouth daily.         .  cholecalciferol (VITAMIN D) 1000 UNITS tablet   Oral   Take 2,000 Units by mouth daily.         . hydrALAZINE (APRESOLINE) 25 MG tablet   Oral   Take 1 tablet (25 mg total) by mouth 2 (two) times daily.   60 tablet   1   . Multiple Vitamin (MULITIVITAMIN WITH MINERALS) TABS   Oral   Take 1 tablet by mouth daily.         Marland Kitchen omega-3 acid ethyl esters (LOVAZA) 1 G capsule   Oral   Take 1 g by mouth 2 (two) times daily.         Marland Kitchen omeprazole (PRILOSEC) 20 MG capsule   Oral   Take 20 mg by mouth daily.         . valsartan (DIOVAN) 320 MG tablet   Oral   Take 320 mg by mouth daily.          BP 186/77  Pulse 80  Temp(Src) 98.1 F (36.7 C) (Oral)  Resp 16  SpO2 100% Physical Exam  Nursing note and vitals reviewed. Constitutional: She appears well-developed and  well-nourished. No distress.  Cardiovascular: Normal rate, regular rhythm and normal heart sounds.   Pulmonary/Chest: Effort normal and breath sounds normal. No respiratory distress. She has no wheezes. She has no rales.  Neurological: She is alert.  Skin: Skin is warm and dry.  Small, 1x1cm skin abrasion to the left anterior mid forearm. No surrounding erythema, no swelling, no induration, no drainage. No tenderness    ED Course   Procedures (including critical care time)  Labs Reviewed - No data to display No results found.  1. Abrasion of right forearm, initial encounter   2. Hypertension     MDM  PT with left forearm lesion. Very small area that appears like an abrasion. No drainage, swelling, redness. No signs of infection. Home with bacitracin and close follow up.   BP elevated. Pt did not take her medications for this. She is asymptomatic for her htn. Instructed to take her meds when she gets home, follow up with pcp.   Filed Vitals:   01/20/13 1216 01/20/13 1227  BP: 196/93 186/77  Pulse: 80   Temp: 98.1 F (36.7 C)   TempSrc: Oral   Resp: 16   SpO2: 100%      Wenceslao Loper A Clydene Burack,  PA-C 01/20/13 1242

## 2013-01-20 NOTE — ED Notes (Signed)
Per pt, was planting flowers and got bit by something-no pain/discomfort

## 2013-01-24 NOTE — ED Provider Notes (Signed)
Medical screening examination/treatment/procedure(s) were performed by non-physician practitioner and as supervising physician I was immediately available for consultation/collaboration.    Nelia Shi, MD 01/24/13 [phone]

## 2013-03-07 DIAGNOSIS — Z1389 Encounter for screening for other disorder: Secondary | ICD-10-CM | POA: Insufficient documentation

## 2013-04-20 ENCOUNTER — Ambulatory Visit: Payer: Self-pay | Admitting: Podiatry

## 2013-10-17 ENCOUNTER — Ambulatory Visit: Payer: Self-pay | Admitting: Podiatry

## 2013-11-14 ENCOUNTER — Encounter: Payer: Self-pay | Admitting: Podiatry

## 2013-11-14 ENCOUNTER — Ambulatory Visit (INDEPENDENT_AMBULATORY_CARE_PROVIDER_SITE_OTHER): Payer: Medicare Other | Admitting: Podiatry

## 2013-11-14 VITALS — BP 116/89 | HR 71 | Resp 16 | Ht 61.0 in | Wt 150.0 lb

## 2013-11-14 DIAGNOSIS — L84 Corns and callosities: Secondary | ICD-10-CM

## 2013-11-14 DIAGNOSIS — M2010 Hallux valgus (acquired), unspecified foot: Secondary | ICD-10-CM

## 2013-11-14 DIAGNOSIS — B351 Tinea unguium: Secondary | ICD-10-CM

## 2013-11-14 DIAGNOSIS — M79609 Pain in unspecified limb: Secondary | ICD-10-CM

## 2013-11-14 NOTE — Progress Notes (Signed)
   Subjective:    Patient ID: [name], female    DOB: Sep 26, 1924, 80 y.o.   MRN: [id number]  HPI Comments: N debridement L 1 - 10 toenails, and soft corn between left 2nd, and 3rd toes D corn -07/2013     1 - 10 toenails O left 2nd toe stubbed C toenails are painful, darkened, and thick     Corn - soft, maserated skin, and pain A shoe wear  T alcohol baths     Review of Systems  All other systems reviewed and are negative.      Objective:   Physical Exam   Orientated x3 black female presents with daughter.  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

## 2014-02-13 ENCOUNTER — Ambulatory Visit: Payer: Medicare Other | Admitting: Podiatry

## 2014-03-14 DIAGNOSIS — I517 Cardiomegaly: Secondary | ICD-10-CM | POA: Insufficient documentation

## 2014-05-04 ENCOUNTER — Emergency Department (HOSPITAL_COMMUNITY): Payer: Medicare Other

## 2014-05-04 ENCOUNTER — Emergency Department (HOSPITAL_COMMUNITY)
Admission: EM | Admit: 2014-05-04 | Discharge: 2014-05-04 | Disposition: A | Discharge status: Home / Self Care | Payer: Medicare Other | Attending: Emergency Medicine | Admitting: Emergency Medicine

## 2014-05-04 ENCOUNTER — Encounter (HOSPITAL_COMMUNITY): Payer: Self-pay | Admitting: *Deleted

## 2014-05-04 DIAGNOSIS — Y9289 Other specified places as the place of occurrence of the external cause: Secondary | ICD-10-CM | POA: Diagnosis not present

## 2014-05-04 DIAGNOSIS — R0781 Pleurodynia: Secondary | ICD-10-CM

## 2014-05-04 DIAGNOSIS — E780 Pure hypercholesterolemia: Secondary | ICD-10-CM | POA: Diagnosis not present

## 2014-05-04 DIAGNOSIS — K219 Gastro-esophageal reflux disease without esophagitis: Secondary | ICD-10-CM | POA: Insufficient documentation

## 2014-05-04 DIAGNOSIS — S3992XA Unspecified injury of lower back, initial encounter: Secondary | ICD-10-CM | POA: Insufficient documentation

## 2014-05-04 DIAGNOSIS — I2510 Atherosclerotic heart disease of native coronary artery without angina pectoris: Secondary | ICD-10-CM | POA: Insufficient documentation

## 2014-05-04 DIAGNOSIS — I10 Essential (primary) hypertension: Secondary | ICD-10-CM | POA: Diagnosis not present

## 2014-05-04 DIAGNOSIS — Y998 Other external cause status: Secondary | ICD-10-CM | POA: Insufficient documentation

## 2014-05-04 DIAGNOSIS — Z79899 Other long term (current) drug therapy: Secondary | ICD-10-CM | POA: Diagnosis not present

## 2014-05-04 DIAGNOSIS — W19XXXA Unspecified fall, initial encounter: Secondary | ICD-10-CM

## 2014-05-04 DIAGNOSIS — W01198A Fall on same level from slipping, tripping and stumbling with subsequent striking against other object, initial encounter: Secondary | ICD-10-CM | POA: Insufficient documentation

## 2014-05-04 DIAGNOSIS — Z7982 Long term (current) use of aspirin: Secondary | ICD-10-CM | POA: Diagnosis not present

## 2014-05-04 DIAGNOSIS — S299XXA Unspecified injury of thorax, initial encounter: Secondary | ICD-10-CM | POA: Insufficient documentation

## 2014-05-04 DIAGNOSIS — S3991XA Unspecified injury of abdomen, initial encounter: Secondary | ICD-10-CM | POA: Diagnosis present

## 2014-05-04 DIAGNOSIS — Y9389 Activity, other specified: Secondary | ICD-10-CM | POA: Insufficient documentation

## 2014-05-04 MED ORDER — HYDRALAZINE HCL 25 MG PO TABS
25.0000 mg | ORAL_TABLET | Freq: Two times a day (BID) | ORAL | Status: DC
  Administered 2014-05-04: 25 mg via ORAL
  Filled 2014-05-04 (×2): qty 1

## 2014-05-04 MED ORDER — ACETAMINOPHEN 325 MG PO TABS
650.0000 mg | ORAL_TABLET | Freq: Once | ORAL | Status: AC
  Administered 2014-05-04: 650 mg via ORAL
  Filled 2014-05-04: qty 2

## 2014-05-04 MED ORDER — ATENOLOL 50 MG PO TABS
50.0000 mg | ORAL_TABLET | Freq: Every day | ORAL | Status: DC
  Administered 2014-05-04: 50 mg via ORAL
  Filled 2014-05-04: qty 1

## 2014-05-04 MED ORDER — AMLODIPINE BESYLATE 5 MG PO TABS
10.0000 mg | ORAL_TABLET | Freq: Every day | ORAL | Status: DC
  Administered 2014-05-04: 10 mg via ORAL
  Filled 2014-05-04: qty 2

## 2014-05-04 MED ORDER — IBUPROFEN 800 MG PO TABS: 800.0000 mg | ORAL_TABLET | Freq: Three times a day (TID) | ORAL | Status: DC

## 2014-05-04 NOTE — ED Notes (Signed)
PT states she tripped over chord and landed on the floor on her back, hitting a chair on the way down.  No bruising or deformity noted.  Pt not on blood thinners.  Denies sob, but c/o nausea.

## 2014-05-04 NOTE — Discharge Instructions (Signed)
Rib Contusion There are no broken ribs.  Take motrin for pain as needed. Follow up with your doctor. Return to the ED if you develop new or worsening symptoms. A rib contusion (bruise) can occur by a blow to the chest or by a fall against a hard object. Usually these will be much better in a couple weeks. If X-rays were taken today and there are no broken bones (fractures), the diagnosis of bruising is made. However, broken ribs may not show up for several days, or may be discovered later on a routine X-ray when signs of healing show up. If this happens to you, it does not mean that something was missed on the X-ray, but simply that it did not show up on the first X-rays. Earlier diagnosis will not usually change the treatment. HOME CARE INSTRUCTIONS   Avoid strenuous activity. Be careful during activities and avoid bumping the injured ribs. Activities that pull on the injured ribs and cause pain should be avoided, if possible.  For the first day or two, an ice pack used every 20 minutes while awake may be helpful. Put ice in a plastic bag and put a towel between the bag and the skin.  Eat a normal, well-balanced diet. Drink plenty of fluids to avoid constipation.  Take deep breaths several times a day to keep lungs free of infection. Try to cough several times a day. Splint the injured area with a pillow while coughing to ease pain. Coughing can help prevent pneumonia.  Wear a rib belt or binder only if told to do so by your caregiver. If you are wearing a rib belt or binder, you must do the breathing exercises as directed by your caregiver. If not used properly, rib belts or binders restrict breathing which can lead to pneumonia.  Only take over-the-counter or prescription medicines for pain, discomfort, or fever as directed by your caregiver. SEEK MEDICAL CARE IF:   You or your child has an oral temperature above 102 F (38.9 C).  Your baby is older than 3 months with a rectal temperature of  100.5 F (38.1 C) or higher for more than 1 day.  You develop a cough, with thick or bloody sputum. SEEK IMMEDIATE MEDICAL CARE IF:   You have difficulty breathing.  You feel sick to your stomach (nausea), have vomiting or belly (abdominal) pain.  You have worsening pain, not controlled with medications, or there is a change in the location of the pain.  You develop sweating or radiation of the pain into the arms, jaw or shoulders, or become light headed or faint.  You or your child has an oral temperature above 102 F (38.9 C), not controlled by medicine.  Your or your baby is older than 3 months with a rectal temperature of 102 F (38.9 C) or higher.  Your baby is 253 months old or younger with a rectal temperature of 100.4 F (38 C) or higher. MAKE SURE YOU:   Understand these instructions.  Will watch your condition.  Will get help right away if you are not doing well or get worse. Document Released: 02/25/2001 Document Revised: 09/27/2012 Document Reviewed: 01/19/2008 [facility] Patient Information 2015 [employer], [place]. This information is not intended to replace advice given to you by your health care provider. Make sure you discuss any questions you have with your health care provider.

## 2014-05-04 NOTE — ED Provider Notes (Signed)
CSN: [id number]     Arrival date & time 05/04/14  0940 History   First MD Initiated Contact with Patient 05/04/14 1141     Chief Complaint  Patient presents with  . Fall  . Flank Pain     (Consider location/radiation/quality/duration/timing/severity/associated sxs/prior Treatment) HPI Comments: Patient denies of left-sided rib and back pain after mechanical fall. She states she was making her bed when she tripped on an electrical cord and fell striking her left upper back on a chair. Denies hitting head or losing consciousness. She does not take any blood thinners. Denies any head, neck, chest or abdominal pain. No low back pain. No shortness of breath. Blood pressure is elevated and she did not take her medications this morning.  Patient is a 80 y.o. female presenting with flank pain. The history is provided by the patient and a relative.  Flank Pain Pertinent negatives include no chest pain, no abdominal pain, no headaches and no shortness of breath.    Past Medical History  Diagnosis Date  . Hypertension   . High cholesterol   . GERD (gastroesophageal reflux disease)   . Hiatal hernia   . Diverticular disease    Past Surgical History  Procedure Laterality Date  . Abdominal hysterectomy     No family history on file. History  Substance Use Topics  . Smoking status: Never Smoker   . Smokeless tobacco: Not on file  . Alcohol Use: No   OB History    No data available     Review of Systems  Constitutional: Negative for fever, activity change and appetite change.  HENT: Negative for congestion and rhinorrhea.   Respiratory: Negative for cough, chest tightness and shortness of breath.   Cardiovascular: Negative for chest pain.  Gastrointestinal: Negative for nausea, vomiting and abdominal pain.  Genitourinary: Positive for flank pain. Negative for vaginal bleeding and vaginal discharge.  Musculoskeletal: Positive for myalgias, back pain and arthralgias.  Skin: Negative  for rash.  Neurological: Negative for dizziness, weakness and headaches.  A complete 10 system review of systems was obtained and all systems are negative except as noted in the HPI and PMH.      Allergies  Iodine  Home Medications   Prior to Admission medications   Medication Sig Start Date End Date Taking? Authorizing Provider  amLODipine (NORVASC) 10 MG tablet Take 10 mg by mouth daily.    Historical Provider, MD  aspirin 325 MG tablet Take 325 mg by mouth daily.    Historical Provider, MD  atenolol (TENORMIN) 50 MG tablet Take 50 mg by mouth daily.    Historical Provider, MD  atorvastatin (LIPITOR) 80 MG tablet Take 80 mg by mouth daily.    Historical Provider, MD  calcium-vitamin D (OSCAL WITH D) 500-200 MG-UNIT per tablet Take 1 tablet by mouth daily.    Historical Provider, MD  cholecalciferol (VITAMIN D) 1000 UNITS tablet Take 2,000 Units by mouth daily.    Historical Provider, MD  hydrALAZINE (APRESOLINE) 25 MG tablet Take 1 tablet (25 mg total) by mouth 2 (two) times daily. 10/01/12   Minda Meoichard A Aronson, MD  ibuprofen (ADVIL,MOTRIN) 800 MG tablet Take 1 tablet (800 mg total) by mouth 3 (three) times daily. 05/04/14   Glynn OctaveStephen Hidaya Daniel, MD  Multiple Vitamin (MULITIVITAMIN WITH MINERALS) TABS Take 1 tablet by mouth daily.    Historical Provider, MD  Omega-3 Fatty Acids (FISH OIL) 1000 MG CAPS Take by mouth.    Historical Provider, MD  omeprazole (PRILOSEC) 20  MG capsule Take 20 mg by mouth daily. 10/30/11   Linwood Dibbles, MD  valsartan (DIOVAN) 320 MG tablet Take 320 mg by mouth daily.    Historical Provider, MD   BP 152/61 mmHg  Pulse 47  Temp(Src) 97.8 F (36.6 C) (Oral)  Resp 14  Ht 5\' 3"  (1.6 m)  Wt 147 lb (66.679 kg)  BMI 26.05 kg/m2  SpO2 99% Physical Exam  Constitutional: She is oriented to person, place, and time. She appears well-developed and well-nourished. No distress.  HENT:  Head: Normocephalic and atraumatic.  Mouth/Throat: Oropharynx is clear and moist. No  oropharyngeal exudate.  Eyes: Conjunctivae and EOM are normal. Pupils are equal, round, and reactive to light.  Neck: Normal range of motion. Neck supple.  No C spine tenderness  Cardiovascular: Normal rate, regular rhythm, normal heart sounds and intact distal pulses.   No murmur heard. Pulmonary/Chest: Effort normal and breath sounds normal. No respiratory distress. She exhibits no tenderness.  L lateral rib tenderness. No bruising or ecchymosis.  No crepitance  Abdominal: Soft. There is no tenderness. There is no rebound and no guarding.  Musculoskeletal: Normal range of motion. She exhibits tenderness. She exhibits no edema.  TTP L ribs and upper back  Neurological: She is alert and oriented to person, place, and time. No cranial nerve deficit. She exhibits normal muscle tone. Coordination normal.  No ataxia on finger to nose bilaterally. No pronator drift. 5/5 strength throughout. CN 2-12 intact. Negative Romberg. Equal grip strength. Sensation intact. Gait is normal.   Skin: Skin is warm.  Psychiatric: She has a normal mood and affect. Her behavior is normal.  Nursing note and vitals reviewed.   ED Course  Procedures (including critical care time) Labs Review Labs Reviewed - No data to display  Imaging Review Dg Chest 2 View  05/04/2014   CLINICAL DATA:  Fall, nausea  EXAM: CHEST - 2 VIEW  COMPARISON:  09/29/2012  FINDINGS: Mild cardiomegaly with diffuse vascular and interstitial prominence. Early edema not excluded. Basilar atelectasis noted with overall low lung volumes. No effusion or pneumothorax. No definite focal pneumonia. Trachea is visualized. Degenerative changes of the spine and shoulders. Atherosclerosis of the aorta.  IMPRESSION: Low volume exam with basilar atelectasis  Cardiomegaly with mild interstitial prominence/early edema.   Electronically Signed   By: Ruel Favors M.D.   On: 05/04/2014 11:14   Ct Head Wo Contrast  05/04/2014   CLINICAL DATA:  Trauma  secondary to a fall over a cord.  EXAM: CT HEAD WITHOUT CONTRAST  TECHNIQUE: Contiguous axial images were obtained from the base of the skull through the vertex without intravenous contrast.  COMPARISON:  09/29/2012  FINDINGS: No mass lesion. No midline shift. No acute hemorrhage or hematoma. No extra-axial fluid collections. No evidence of acute infarction. There is slight diffuse cerebral cortical atrophy.  No osseous abnormality.  IMPRESSION: Slight atrophy.  Otherwise normal exam.   Electronically Signed   By: Geanie Cooley M.D.   On: 05/04/2014 14:26   Ct Chest Wo Contrast  05/04/2014   CLINICAL DATA:  Fall.  EXAM: CT CHEST WITHOUT CONTRAST  TECHNIQUE: Multidetector CT imaging of the chest was performed following the standard protocol without IV contrast.  COMPARISON:  CT scan of January 05, 2010.  FINDINGS: No pneumothorax or pleural effusion is noted. No acute osseous abnormality is noted in the chest. Visualized portion of upper abdomen demonstrates stable left adrenal adenoma. Coronary artery calcifications are noted suggesting coronary artery disease. No mediastinal  mass or adenopathy is noted. Minimal subsegmental atelectasis is noted posteriorly in both lung bases. No pulmonary nodule or mass is noted.  IMPRESSION: Minimal bilateral posterior basilar subsegmental atelectasis is noted. Stable left adrenal adenoma. Coronary artery calcifications are noted suggesting coronary artery disease. No other significant abnormality seen in the chest.   Electronically Signed   By: Roque LiasJames  Green M.D.   On: 05/04/2014 14:32     EKG Interpretation   Date/Time:  Thursday May 04 2014 15:21:42 EST Ventricular Rate:  51 PR Interval:  188 QRS Duration: 101 QT Interval:  465 QTC Calculation: 428 R Axis:   37 Text Interpretation:  Sinus rhythm Posterior infarct, old Borderline T  abnormalities, inferior leads nonspecific T wave flattening Confirmed by  Manus GunningANCOUR  MD, Brynli Ollis [phone]([id number]) on 05/04/2014 3:30:38 PM       MDM   Final diagnoses:  Fall, initial encounter  Rib pain    Mechanical fall with left back pain. No loss of consciousness. No chest pain or shortness of breath. Hypertensive did not take her medications today.  Chest x-ray in triage shows no rib fracture or pneumothorax.  BP improved after patient's meds given in the ED. No evidence of rib fracture or pneumothorax. CT head negative.  Pain controlled in the ED. Patient is ambulatory. We'll treat pain with anti-inflammatories and Tylenol at home. Follow with PCP. Return precautions discussed.  BP 152/61 mmHg  Pulse 47  Temp(Src) 97.8 F (36.6 C) (Oral)  Resp 14  Ht 5\' 3"  (1.6 m)  Wt 147 lb (66.679 kg)  BMI 26.05 kg/m2  SpO2 99%   Glynn OctaveStephen Kela Baccari, MD 05/04/14 1627

## 2014-05-04 NOTE — ED Notes (Signed)
Patient returned from CT

## 2014-12-19 DIAGNOSIS — I119 Hypertensive heart disease without heart failure: Secondary | ICD-10-CM | POA: Insufficient documentation

## 2015-09-18 ENCOUNTER — Encounter (HOSPITAL_COMMUNITY): Payer: Self-pay | Admitting: Emergency Medicine

## 2015-09-18 ENCOUNTER — Emergency Department (HOSPITAL_COMMUNITY)
Admission: EM | Admit: 2015-09-18 | Discharge: 2015-09-18 | Disposition: A | Discharge status: Home / Self Care | Payer: Medicare Other | Attending: Emergency Medicine | Admitting: Emergency Medicine

## 2015-09-18 DIAGNOSIS — K219 Gastro-esophageal reflux disease without esophagitis: Secondary | ICD-10-CM | POA: Insufficient documentation

## 2015-09-18 DIAGNOSIS — Z79899 Other long term (current) drug therapy: Secondary | ICD-10-CM | POA: Insufficient documentation

## 2015-09-18 DIAGNOSIS — K146 Glossodynia: Secondary | ICD-10-CM | POA: Diagnosis present

## 2015-09-18 DIAGNOSIS — I10 Essential (primary) hypertension: Secondary | ICD-10-CM | POA: Diagnosis not present

## 2015-09-18 DIAGNOSIS — E7800 Pure hypercholesterolemia, unspecified: Secondary | ICD-10-CM | POA: Insufficient documentation

## 2015-09-18 LAB — CBC WITH DIFFERENTIAL/PLATELET
BASOS ABS: 0 10*3/uL (ref 0.0–0.1)
Basophils Relative: 0 %
EOS ABS: 0.1 10*3/uL (ref 0.0–0.7)
Eosinophils Relative: 1 %
HCT: 35.9 % — ABNORMAL LOW (ref 36.0–46.0)
Hemoglobin: 11.2 g/dL — ABNORMAL LOW (ref 12.0–15.0)
LYMPHS ABS: 1.3 10*3/uL (ref 0.7–4.0)
Lymphocytes Relative: 21 %
MCH: 21.7 pg — ABNORMAL LOW (ref 26.0–34.0)
MCHC: 31.2 g/dL (ref 30.0–36.0)
MCV: 69.7 fL — ABNORMAL LOW (ref 78.0–100.0)
MONO ABS: 0.4 10*3/uL (ref 0.1–1.0)
Monocytes Relative: 7 %
Neutro Abs: 4.2 10*3/uL (ref 1.7–7.7)
Neutrophils Relative %: 71 %
PLATELETS: 284 10*3/uL (ref 150–400)
RBC: 5.15 MIL/uL — AB (ref 3.87–5.11)
RDW: 16.8 % — AB (ref 11.5–15.5)
WBC: 6 10*3/uL (ref 4.0–10.5)

## 2015-09-18 LAB — MAGNESIUM: MAGNESIUM: 2.3 mg/dL (ref 1.7–2.4)

## 2015-09-18 LAB — COMPREHENSIVE METABOLIC PANEL
ALBUMIN: 4.2 g/dL (ref 3.5–5.0)
ALT: 19 U/L (ref 14–54)
ANION GAP: 10 (ref 5–15)
AST: 26 U/L (ref 15–41)
Alkaline Phosphatase: 73 U/L (ref 38–126)
BUN: 12 mg/dL (ref 6–20)
CHLORIDE: 107 mmol/L (ref 101–111)
CO2: 25 mmol/L (ref 22–32)
Calcium: 9.7 mg/dL (ref 8.9–10.3)
Creatinine, Ser: 0.89 mg/dL (ref 0.44–1.00)
GFR calc Af Amer: >60 mL/min (ref >60)
GFR calc non Af Amer: 56 mL/min — ABNORMAL LOW (ref >60)
GLUCOSE: 93 mg/dL (ref 65–99)
POTASSIUM: 4.4 mmol/L (ref 3.5–5.1)
Sodium: 142 mmol/L (ref 135–145)
Total Bilirubin: 0.2 mg/dL — ABNORMAL LOW (ref 0.3–1.2)
Total Protein: 8 g/dL (ref 6.5–8.1)

## 2015-09-18 LAB — I-STAT TROPONIN, ED: TROPONIN I, POC: 0.01 ng/mL (ref 0.00–0.08)

## 2015-09-18 MED ORDER — MAGIC MOUTHWASH: 5.0000 mL | Freq: Three times a day (TID) | ORAL | Status: DC | PRN

## 2015-09-18 MED ORDER — LIDOCAINE VISCOUS 2 % MT SOLN
15.0000 mL | Freq: Once | OROMUCOSAL | Status: AC
  Administered 2015-09-18: 15 mL via OROMUCOSAL
  Filled 2015-09-18: qty 15

## 2015-09-18 NOTE — ED Provider Notes (Signed)
CSN: [id number]     Arrival date & time 09/18/15  1407 History   First MD Initiated Contact with Patient 09/18/15 1500     Chief Complaint  Patient presents with  . Bad taste      (Consider location/radiation/quality/duration/timing/severity/associated sxs/prior Treatment) HPI  Tongue burning since Sunday, soreness, feels burning of whole tongue to lips on both sides Tongue feels sore "like you touched a rusty nail" Burning Constant since Sunday Severe burning, kept from sleeping  Thinks inhaled scent of tide detergent and it may be the cause  No numbness/weakness/trouble speaking/change in vision  Past Medical History  Diagnosis Date  . Hypertension   . High cholesterol   . GERD (gastroesophageal reflux disease)   . Hiatal hernia   . Diverticular disease    Past Surgical History  Procedure Laterality Date  . Abdominal hysterectomy     History reviewed. No pertinent family history. Social History  Substance Use Topics  . Smoking status: Never Smoker   . Smokeless tobacco: None  . Alcohol Use: No   OB History    No data available     Review of Systems  Constitutional: Negative for fever.  HENT: Negative for sore throat.        Burning in tongue  Eyes: Negative for visual disturbance.  Respiratory: Negative for cough and shortness of breath.   Cardiovascular: Negative for chest pain.  Gastrointestinal: Negative for abdominal pain.  Genitourinary: Negative for difficulty urinating.  Musculoskeletal: Negative for back pain and neck pain.  Skin: Negative for rash.  Neurological: Negative for syncope and headaches.      Allergies  Iodine  Home Medications   Prior to Admission medications   Medication Sig Start Date End Date Taking? Authorizing Provider  acetaminophen (TYLENOL) 325 MG tablet Take 325 mg by mouth daily.   Yes Historical Provider, MD  amLODipine (NORVASC) 10 MG tablet Take 10 mg by mouth daily.   Yes Historical Provider, MD  atenolol  (TENORMIN) 50 MG tablet Take 50 mg by mouth daily.   Yes Historical Provider, MD  atorvastatin (LIPITOR) 80 MG tablet Take 80 mg by mouth daily.   Yes Historical Provider, MD  calcium-vitamin D (OSCAL WITH D) 500-200 MG-UNIT per tablet Take 1 tablet by mouth daily.   Yes Historical Provider, MD  cholecalciferol (VITAMIN D) 1000 UNITS tablet Take 2,000 Units by mouth daily.   Yes Historical Provider, MD  hydrALAZINE (APRESOLINE) 25 MG tablet Take 1 tablet (25 mg total) by mouth 2 (two) times daily. 10/01/12  Yes Geoffry Paradise, MD  Multiple Vitamin (MULITIVITAMIN WITH MINERALS) TABS Take 1 tablet by mouth daily.   Yes Historical Provider, MD  Omega-3 Fatty Acids (FISH OIL) 1000 MG CAPS Take by mouth.   Yes Historical Provider, MD  omeprazole (PRILOSEC) 20 MG capsule Take 20 mg by mouth daily. 10/30/11  Yes Linwood Dibbles, MD  valsartan (DIOVAN) 320 MG tablet Take 320 mg by mouth daily.   Yes Historical Provider, MD  ibuprofen (ADVIL,MOTRIN) 800 MG tablet Take 1 tablet (800 mg total) by mouth 3 (three) times daily. Patient not taking: Reported on 09/18/2015 05/04/14   Glynn Octave, MD  magic mouthwash SOLN Take 5 mLs by mouth 3 (three) times daily as needed for mouth pain. 09/18/15   Alvira Monday, MD   BP 177/70 mmHg  Pulse 48  Temp(Src) 98.4 F (36.9 C) (Oral)  Resp 16  SpO2 98% Physical Exam  Constitutional: She is oriented to person, place, and time. She  appears well-developed and well-nourished. No distress.  HENT:  Head: Normocephalic and atraumatic.  Mouth/Throat: No oropharyngeal exudate (no thrush, no abd of tongue).  Eyes: Conjunctivae and EOM are normal. Pupils are equal, round, and reactive to light.  Neck: Normal range of motion.  Cardiovascular: Normal rate, regular rhythm, normal heart sounds and intact distal pulses.  Exam reveals no gallop and no friction rub.   No murmur heard. Pulmonary/Chest: Effort normal and breath sounds normal. No respiratory distress. She has no wheezes.  She has no rales.  Abdominal: Soft. She exhibits no distension. There is no tenderness. There is no guarding.  Musculoskeletal: She exhibits no edema or tenderness.  Neurological: She is alert and oriented to person, place, and time. She has normal strength. No cranial nerve deficit or sensory deficit. She displays a negative Romberg sign. Coordination and gait normal. GCS eye subscore is 4. GCS verbal subscore is 5. GCS motor subscore is 6.  Skin: Skin is warm and dry. No rash noted. She is not diaphoretic. No erythema.  Nursing note and vitals reviewed.   ED Course  Procedures (including critical care time) Labs Review Labs Reviewed  CBC WITH DIFFERENTIAL/PLATELET - Abnormal; Notable for the following:    RBC 5.15 (*)    Hemoglobin 11.2 (*)    HCT 35.9 (*)    MCV 69.7 (*)    MCH 21.7 (*)    RDW 16.8 (*)    All other components within normal limits  COMPREHENSIVE METABOLIC PANEL - Abnormal; Notable for the following:    Total Bilirubin 0.2 (*)    GFR calc non Af Amer 56 (*)    All other components within normal limits  MAGNESIUM  I-STAT TROPOININ, ED    Imaging Review No results found. I have personally reviewed and evaluated these images and lab results as part of my medical decision-making.   EKG Interpretation   Date/Time:  Tuesday September 18 2015 15:51:29 EDT Ventricular Rate:  52 PR Interval:  162 QRS Duration: 117 QT Interval:  451 QTC Calculation: 419 R Axis:   31 Text Interpretation:  Sinus rhythm Nonspecific intraventricular conduction  delay Nonspecific T abnormalities, lateral leads Baseline wander in  lead(s) V6 No significant change since last tracing Confirmed by  [facility] MD, Ameah Chanda ([id number]) on 09/18/2015 4:33:58 PM      MDM   Final diagnoses:  Burning tongue   82 year old female with a history of hypertension, hypercholesterolemia, GERD presents with concern for burning of the tongue. She does report a brief episode where the burning had extended to  her chest, and the ECG and troponin show no acute changes. Have low suspicion for cardiac etiology symptoms given primary tongue burning. CBC, CMP showed no significant abnormalities. Her neurologic exam is normal, and have low suspicion for CVA.  No sign of thrush on exam. Pt does report some improvement with lidocaine.  Will provide rx for magic mouthwash.  Possible other etiology of the burning tongue, including early burning mouth syndrome or other vitamin deficiencies, and recommend PCP follow-up.   Alvira MondayErin Zeya Balles, MD 09/19/15 [phone]

## 2015-09-18 NOTE — Discharge Instructions (Signed)
Glossitis °Glossitis is inflammation of the tongue. This may be a stand-alone condition or it may be a symptom of a different condition that you have. Generally, glossitis goes away when its cause is identified and treated. Glossitis can be dangerous if it causes difficulty breathing. °CAUSES  °This condition can be caused by many different things. In some cases, the cause may not be known. Certain underlying conditions may cause glossitis, such as:  °· Viral, bacterial, or yeast infections. °· Allergies. °· Dysfunction of the skin and mucous membranes. This can happen with certain autoimmune disorders. °· Abnormal tissue growths (tumors). °· Lack of healthy red blood cells (anemia). °· Movement of stomach acid into the tube that connects the mouth and the stomach (gastroesophageal reflux). °· Lack of proper nutrition or certain vitamins. °· Certain lifelong (chronic) medical conditions, such as diabetes. °Sometimes, glossitis may not be caused by an underlying condition. In these cases, glossitis may be caused by:  °· Use of tobacco products, such as cigarettes, chewing tobacco, or e-cigarettes. °· Excessive alcohol use. °· Tongue injury or irritation. °· Certain medicines, such as medicines to treat cancer. °RISK FACTORS °This condition is more likely to develop in:  °· People who are 50 years or older. °· Men. °· People who are taking antibiotics or steroids, such as asthma medicines. °· People who drink alcohol excessively. °· People who use tobacco products, including cigarettes, chewing tobacco, or e-cigarettes. °· People with chronic medical conditions, such as immune diseases or cancer. °· People who do not brush or floss their teeth regularly. °· People who lack proper nutrition or are anemic. °SYMPTOMS  °Symptoms of this condition vary depending on the cause. Symptoms may include:  °· Swelling of the tongue. °· Pain and tenderness in the tongue. Sometimes, this condition is painless. °· Changes in tongue  color. The tongue may be pale or bright red. °· Smooth areas on the tongue's surface. °· A small mass of tissue (node) or white patch on the tongue. °· Difficulty chewing, swallowing, or talking. °· Difficulty breathing. °DIAGNOSIS  °This condition is diagnosed based on a physical exam and medical history. Your health care provider may ask you about your eating and drinking habits. You may have tests, including:  °· Blood tests.   °· Removal of a small amount of cells from the tongue that are examined under a microscope (biopsy).   °You may be given the name of a dentist or a health care provider who specializes in ear, nose, and throat (ENT) problems (otolaryngologist). °TREATMENT  °Treatment for this condition depends on the underlying cause, and may include:  °· Following instructions from your health care provider about keeping your mouth clean and avoiding irritants that may have caused your condition or made it worse. °· Nutritional therapy. Your health care provider may tell you to change your eating and drinking habits or take a nutritional supplement. °· Managing underlying conditions that may have caused your glossitis. °· Medicines, such as: °¨ Corticosteroids to reduce inflammation. °¨ Antibiotics if your condition was caused by an infection. °¨ Local anesthetics that numb your tongue or mouth (local anesthetics). °HOME CARE INSTRUCTIONS °· Keep your teeth and mouth clean. This includes brushing and flossing frequently and having regular dental checkups. °· If you wear dentures or dental braces, work with your dentist to make sure they fit correctly. °· Eat healthy foods. Follow instructions from your health care provider about eating or drinking restrictions. °· Avoid tobacco products, including cigarettes, chewing tobacco, or e-cigarettes. If   you need help quitting, ask your health care provider. °· Avoid excessive alcohol use. °· Avoid any irritants that may have caused your condition or made it  worse, such as chemicals or certain foods. °· Keep all follow-up visits as told by your health care provider. This is important. °SEEK MEDICAL CARE IF: °· You have a fever. °· You develop new symptoms. °· You have symptoms that do not get better with medicine or get worse. °· You have symptoms that do not go away after 10 days. °· You cannot eat or drink because of your pain. °SEEK IMMEDIATE MEDICAL CARE IF:  °· You have severe pain or swelling. °· You have difficulty breathing, swallowing, or talking. °  °This information is not intended to replace advice given to you by your health care provider. Make sure you discuss any questions you have with your health care provider. °  °Document Released: 05/23/2002 Document Revised: 02/21/2015 Document Reviewed: 10/18/2014 °Elsevier Interactive Patient Education ©2016 [employer] ° °

## 2015-09-18 NOTE — ED Notes (Signed)
PT DISCHARGED. INSTRUCTIONS AND PRESCRIPTION GIVEN. AAOX3. PT IN NO APPARENT DISTRESS OR PAIN. THE OPPORTUNITY TO ASK QUESTIONS WAS PROVIDED. 

## 2015-09-18 NOTE — ED Notes (Signed)
Pt reports a bad "burning-like" taste in mouth intermittently since Sunday. Did not take BP meds today. Denies any other sensory abnormalities (smell, sights, sounds). No focal neuro deficits observed. Daughter reports her using some new detergent and is afraid the patient may have inhaled some that's irritating her GI tract.

## 2015-09-19 ENCOUNTER — Telehealth: Payer: Self-pay | Admitting: *Deleted

## 2017-02-20 DIAGNOSIS — M25519 Pain in unspecified shoulder: Secondary | ICD-10-CM | POA: Insufficient documentation

## 2018-07-06 ENCOUNTER — Emergency Department (HOSPITAL_COMMUNITY)
Admission: EM | Admit: 2018-07-06 | Discharge: 2018-07-06 | Disposition: A | Discharge status: Home / Self Care | Payer: Medicare Other | Attending: Emergency Medicine | Admitting: Emergency Medicine

## 2018-07-06 ENCOUNTER — Emergency Department (HOSPITAL_COMMUNITY): Payer: Medicare Other

## 2018-07-06 ENCOUNTER — Encounter (HOSPITAL_COMMUNITY): Payer: Self-pay

## 2018-07-06 ENCOUNTER — Other Ambulatory Visit: Payer: Self-pay

## 2018-07-06 DIAGNOSIS — Z79899 Other long term (current) drug therapy: Secondary | ICD-10-CM | POA: Diagnosis not present

## 2018-07-06 DIAGNOSIS — I10 Essential (primary) hypertension: Secondary | ICD-10-CM | POA: Insufficient documentation

## 2018-07-06 DIAGNOSIS — R079 Chest pain, unspecified: Secondary | ICD-10-CM | POA: Diagnosis not present

## 2018-07-06 LAB — COMPREHENSIVE METABOLIC PANEL
ALK PHOS: 59 U/L (ref 38–126)
ALT: 12 U/L (ref 0–44)
AST: 19 U/L (ref 15–41)
Albumin: 3.8 g/dL (ref 3.5–5.0)
Anion gap: 8 (ref 5–15)
BILIRUBIN TOTAL: 1.1 mg/dL (ref 0.3–1.2)
BUN: 12 mg/dL (ref 8–23)
CALCIUM: 9.5 mg/dL (ref 8.9–10.3)
CO2: 25 mmol/L (ref 22–32)
Chloride: 106 mmol/L (ref 98–111)
Creatinine, Ser: 0.83 mg/dL (ref 0.44–1.00)
GFR calc Af Amer: >60 mL/min (ref >60)
Glucose, Bld: 96 mg/dL (ref 70–99)
POTASSIUM: 3.7 mmol/L (ref 3.5–5.1)
Sodium: 139 mmol/L (ref 135–145)
TOTAL PROTEIN: 7.4 g/dL (ref 6.5–8.1)

## 2018-07-06 LAB — CBC
HCT: 37.9 % (ref 36.0–46.0)
HEMOGLOBIN: 11.2 g/dL — AB (ref 12.0–15.0)
MCH: 22 pg — ABNORMAL LOW (ref 26.0–34.0)
MCHC: 29.6 g/dL — AB (ref 30.0–36.0)
MCV: 74.3 fL — ABNORMAL LOW (ref 80.0–100.0)
PLATELETS: 263 10*3/uL (ref 150–400)
RBC: 5.1 MIL/uL (ref 3.87–5.11)
RDW: 16.3 % — AB (ref 11.5–15.5)
WBC: 4.4 10*3/uL (ref 4.0–10.5)
nRBC: 0 % (ref 0.0–0.2)

## 2018-07-06 LAB — URINALYSIS, ROUTINE W REFLEX MICROSCOPIC
BILIRUBIN URINE: NEGATIVE
Glucose, UA: NEGATIVE mg/dL
HGB URINE DIPSTICK: NEGATIVE
KETONES UR: NEGATIVE mg/dL
Leukocytes, UA: NEGATIVE
NITRITE: NEGATIVE
PROTEIN: NEGATIVE mg/dL
Specific Gravity, Urine: 1.011 (ref 1.005–1.030)
pH: 7 (ref 5.0–8.0)

## 2018-07-06 LAB — TROPONIN I: Troponin I: <0.03 ng/mL (ref <0.03)

## 2018-07-06 LAB — I-STAT TROPONIN, ED: TROPONIN I, POC: 0.01 ng/mL (ref 0.00–0.08)

## 2018-07-06 LAB — LIPASE, BLOOD: Lipase: 37 U/L (ref 11–51)

## 2018-07-06 MED ORDER — IOPAMIDOL (ISOVUE-370) INJECTION 76%
INTRAVENOUS | Status: AC
  Filled 2018-07-06: qty 100

## 2018-07-06 MED ORDER — IOPAMIDOL (ISOVUE-370) INJECTION 76%
100.0000 mL | Freq: Once | INTRAVENOUS | Status: AC | PRN
  Administered 2018-07-06: 100 mL via INTRAVENOUS

## 2018-07-06 MED ORDER — SODIUM CHLORIDE (PF) 0.9 % IJ SOLN
INTRAMUSCULAR | Status: AC
  Filled 2018-07-06: qty 50

## 2018-07-06 MED ORDER — HYDRALAZINE HCL 20 MG/ML IJ SOLN
10.0000 mg | Freq: Once | INTRAMUSCULAR | Status: AC
  Administered 2018-07-06: 10 mg via INTRAVENOUS
  Filled 2018-07-06: qty 1

## 2018-07-06 NOTE — ED Notes (Signed)
Patient in radiology

## 2018-07-06 NOTE — Discharge Instructions (Addendum)
Please return for any problem.  Follow-up with your regular care provider as instructed.  Please maintain a home record of your blood pressures as instructed.  This will be beneficial to your regular care provider in order for them to guide your blood pressure management.  Of note, there was an incidental finding on your CT.  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 The inferior mesenteric artery supplies blood to your gut.  This is not the reason for your symptoms today.  If you develop significant abdominal pain, nausea, vomiting, or fever please return immediately to the ER for evaluation.

## 2018-07-06 NOTE — ED Provider Notes (Signed)
[facility]-EMERGENCY DEPT Provider Note   CSN: [id number] Arrival date & time: 07/06/18  1145     History   Chief Complaint Chief Complaint  Patient presents with  . Abdominal Pain  . Hypertension    HPI [name] is a 85 y.o. female.  HPI  85 year old female history of hypertension, GERD, and diverticular disease who presents today complaining of 1 month history of sharp stabbing pain in bilateral breasts.  She describes the pain is sharp and was initially below the left breast but now is occasionally on the right side.  She points to the chest wall beneath her breasts bilaterally.  States that the pain is sharp with out any significant associated symptoms.  Reports no complaints of cough, fever, dyspnea, nausea, vomiting, fever, chills, UTI symptoms, constipation, or diarrhea.  She reports taking all medications as prescribed.  Past Medical History:  Diagnosis Date  . Diverticular disease   . GERD (gastroesophageal reflux disease)   . Hiatal hernia   . High cholesterol   . Hypertension     Patient Active Problem List   Diagnosis Date Noted  . Syncope 09/29/2012  . Hypertension 09/29/2012  . Hyperlipidemia 09/29/2012  . Anemia 09/29/2012  . Chronic abdominal pain 09/29/2012    Past Surgical History:  Procedure Laterality Date  . ABDOMINAL HYSTERECTOMY    . HYSTEROTOMY    . LIVER BIOPSY       OB History   No obstetric history on file.      Home Medications    Prior to Admission medications   Medication Sig Start Date End Date Taking? Authorizing Provider  amLODipine (NORVASC) 10 MG tablet Take 10 mg by mouth daily.   Yes [provider]  atenolol (TENORMIN) 50 MG tablet Take 50 mg by mouth daily.   Yes [provider]  atorvastatin (LIPITOR) 80 MG tablet Take 80 mg by mouth daily.   Yes [provider]  calcium-vitamin D (OSCAL WITH D) 500-200 MG-UNIT per tablet Take 1 tablet by mouth daily.   Yes [provider]  cholecalciferol (VITAMIN D) 1000 UNITS tablet Take 2,000 Units by mouth daily.   Yes [provider]  hydrALAZINE (APRESOLINE) 25 MG tablet Take 1 tablet (25 mg total) by mouth 2 (two) times daily. 10/01/12  Yes Geoffry ParadiseAronson, Richard, MD  Multiple Vitamin (MULITIVITAMIN WITH MINERALS) TABS Take 1 tablet by mouth daily.   Yes [provider]  naproxen sodium (ALEVE) 220 MG tablet Take 220 mg by mouth daily as needed (pain).   Yes [provider]  Omega-3 Fatty Acids (FISH OIL) 1000 MG CAPS Take 1,000 mg by mouth daily.    Yes [provider]  valsartan (DIOVAN) 320 MG tablet Take 320 mg by mouth daily.   Yes [provider]  ibuprofen (ADVIL,MOTRIN) 800 MG tablet Take 1 tablet (800 mg total) by mouth 3 (three) times daily. Patient not taking: Reported on 07/06/2018 05/04/14   Glynn Octaveancour, Stephen, MD  magic mouthwash SOLN Take 5 mLs by mouth 3 (three) times daily as needed for mouth pain. Patient not taking: Reported on 07/06/2018 09/18/15   Alvira MondaySchlossman, Erin, MD    Family History No family history on file.  Social History Social History   Tobacco Use  . Smoking status: Never Smoker  . Smokeless tobacco: Never Used  Substance Use Topics  . Alcohol use: No  . Drug use: No     Allergies   Iodine   Review of Systems  Review of Systems  All other systems reviewed and are negative.    Physical Exam Updated Vital Signs BP (!) 200/80 (BP Location: Left Arm)   Pulse 62   Temp 97.9 F (36.6 C) (Oral)   Resp 16   Ht 1.6 m (5\' 3" )   Wt 59.4 kg   SpO2 100%   BMI 23.21 kg/m   Physical Exam Vitals signs and nursing note reviewed.  Constitutional:      General: She is not in acute distress.    Appearance: She is well-developed. She is not ill-appearing, toxic-appearing or diaphoretic.  HENT:     Head: Normocephalic and atraumatic.  Eyes:     Extraocular Movements: Extraocular movements intact.  Cardiovascular:     Rate and  Rhythm: Normal rate and regular rhythm.     Heart sounds: Normal heart sounds.  Pulmonary:     Effort: Pulmonary effort is normal.     Breath sounds: Normal breath sounds.  Abdominal:     General: Abdomen is flat. Bowel sounds are normal.     Palpations: Abdomen is soft.     Tenderness: There is no abdominal tenderness.  Skin:    General: Skin is warm and dry.     Capillary Refill: Capillary refill takes less than 2 seconds.  Neurological:     General: No focal deficit present.     Mental Status: She is alert.  Psychiatric:        Mood and Affect: Mood normal.      ED Treatments / Results  Labs (all labs ordered are listed, but only abnormal results are displayed) Labs Reviewed  CBC - Abnormal; Notable for the following components:      Result Value   Hemoglobin 11.2 (*)    MCV 74.3 (*)    MCH 22.0 (*)    MCHC 29.6 (*)    RDW 16.3 (*)    All other components within normal limits  LIPASE, BLOOD  COMPREHENSIVE METABOLIC PANEL  TROPONIN I  URINALYSIS, ROUTINE W REFLEX MICROSCOPIC  I-STAT TROPONIN, ED    EKG EKG Interpretation  Date/Time:  Tuesday July 06 2018 12:11:38 EST Ventricular Rate:  72 PR Interval:    QRS Duration: 90 QT Interval:  391 QTC Calculation: 428 R Axis:   58 Text Interpretation:  Sinus rhythm Baseline wander in lead(s) I III aVR aVL No significant change since last tracing Confirmed by Margarita Grizzleay, Carle Fenech [phone]([id number]) on 07/06/2018 2:28:34 PM   Radiology No results found.  Procedures Procedures (including critical care time)  Medications Ordered in ED Medications  hydrALAZINE (APRESOLINE) injection 10 mg (has no administration in time range)     Initial Impression / Assessment and Plan / ED Course  I have reviewed the triage vital signs and the nursing notes.  Pertinent labs & imaging results that were available during my care of the patient were reviewed by me and considered in my medical decision making (see chart for details).      85 year old female history of hypertension who presents today complaining of lower chest and upper abdominal pain that has been sharp and coming and going over the past month.  Pain is lateralized sharp and under her breasts.  I have a low index of suspicion for cardiac etiology.  Review of EKG and troponin are without evidence of acute cardiac ischemia.  On her exam she has no specific findings.  Her pulses are equal.  Her lungs are clear heart is regular rate rhythm.  She is  very hypertensive but states that she has been taking her medications.  She is given IV hydralazine and is having a CT angiogram of her chest and abdomen to evaluate for dissection. Discussed with Dr. Rodena Medin and he will follow-up CT scanning and recheck patient's blood pressure and further disposition Final Clinical Impressions(s) / ED Diagnoses   Final diagnoses:  Hypertension, unspecified type  Chest pain, unspecified type    ED Discharge Orders    None       Margarita Grizzle, MD 07/06/18 1531

## 2018-07-06 NOTE — ED Triage Notes (Addendum)
Patient is AOx4 and ambulatory. Patient is here with son at bedside. Patient is complaining of RUQ pain that feels like needles and patient was also having 2-3 days ago pain on LUQ. Patient states stomach burns as well.  Patient son is stating patient has sniffles and cough.

## 2018-07-06 NOTE — ED Provider Notes (Signed)
Patient seen after signout.  Patient is presenting for evaluation of intermittent abdominal discomfort.  She is describing sharp stabbing pain to the anterior chest wall and upper abdomen.  This is been an ongoing problem for at least the last several weeks if not 2 to 3 months.  Work-up today does not suggest acute illness.  Screening labs are on the whole without significant abnormality.  Patient was noted to be hypertensive upon initial evaluation.  This improved during the course of her evaluation.  She is aware of the need for close follow-up with her regular care provider for further treatment of her hypertension.  CT imaging did not reveal significant acute pathology.  There was an incidental finding of a stenosis of the inferior mesenteric artery.  Patient's described symptoms are not consistent with mesenteric ischemia.  Patient does understand the need for close follow-up.  Patient desires discharge.  She understands need for close follow-up.  Strict return precautions given and understood.   Wynetta Fines, MD 07/06/18 [phone]

## 2019-04-19 DIAGNOSIS — H04129 Dry eye syndrome of unspecified lacrimal gland: Secondary | ICD-10-CM | POA: Insufficient documentation

## 2019-04-19 DIAGNOSIS — R079 Chest pain, unspecified: Secondary | ICD-10-CM | POA: Insufficient documentation

## 2019-10-18 DIAGNOSIS — R627 Adult failure to thrive: Secondary | ICD-10-CM | POA: Insufficient documentation

## 2019-12-27 ENCOUNTER — Emergency Department (HOSPITAL_COMMUNITY)
Admission: EM | Admit: 2019-12-27 | Discharge: 2019-12-28 | Disposition: A | Discharge status: Home / Self Care | Payer: Medicare Other | Attending: Emergency Medicine | Admitting: Emergency Medicine

## 2019-12-27 ENCOUNTER — Encounter (HOSPITAL_COMMUNITY): Payer: Self-pay

## 2019-12-27 ENCOUNTER — Other Ambulatory Visit: Payer: Self-pay

## 2019-12-27 ENCOUNTER — Emergency Department (HOSPITAL_COMMUNITY): Payer: Medicare Other

## 2019-12-27 DIAGNOSIS — R109 Unspecified abdominal pain: Secondary | ICD-10-CM | POA: Insufficient documentation

## 2019-12-27 DIAGNOSIS — R600 Localized edema: Secondary | ICD-10-CM | POA: Diagnosis not present

## 2019-12-27 DIAGNOSIS — R1310 Dysphagia, unspecified: Secondary | ICD-10-CM | POA: Insufficient documentation

## 2019-12-27 DIAGNOSIS — I10 Essential (primary) hypertension: Secondary | ICD-10-CM | POA: Insufficient documentation

## 2019-12-27 DIAGNOSIS — M542 Cervicalgia: Secondary | ICD-10-CM | POA: Insufficient documentation

## 2019-12-27 DIAGNOSIS — Z79899 Other long term (current) drug therapy: Secondary | ICD-10-CM | POA: Insufficient documentation

## 2019-12-27 DIAGNOSIS — K219 Gastro-esophageal reflux disease without esophagitis: Secondary | ICD-10-CM | POA: Diagnosis not present

## 2019-12-27 DIAGNOSIS — R079 Chest pain, unspecified: Secondary | ICD-10-CM

## 2019-12-27 LAB — CBC
HCT: 39.1 % (ref 36.0–46.0)
Hemoglobin: 11.7 g/dL — ABNORMAL LOW (ref 12.0–15.0)
MCH: 21.5 pg — ABNORMAL LOW (ref 26.0–34.0)
MCHC: 29.9 g/dL — ABNORMAL LOW (ref 30.0–36.0)
MCV: 71.7 fL — ABNORMAL LOW (ref 80.0–100.0)
Platelets: 233 10*3/uL (ref 150–400)
RBC: 5.45 MIL/uL — ABNORMAL HIGH (ref 3.87–5.11)
RDW: 16.4 % — ABNORMAL HIGH (ref 11.5–15.5)
WBC: 6.3 10*3/uL (ref 4.0–10.5)
nRBC: 0 % (ref 0.0–0.2)

## 2019-12-27 LAB — BASIC METABOLIC PANEL
Anion gap: 11 (ref 5–15)
BUN: 15 mg/dL (ref 8–23)
CO2: 25 mmol/L (ref 22–32)
Calcium: 9.8 mg/dL (ref 8.9–10.3)
Chloride: 103 mmol/L (ref 98–111)
Creatinine, Ser: 1.15 mg/dL — ABNORMAL HIGH (ref 0.44–1.00)
GFR calc Af Amer: 48 mL/min — ABNORMAL LOW (ref >60)
GFR calc non Af Amer: 41 mL/min — ABNORMAL LOW (ref >60)
Glucose, Bld: 86 mg/dL (ref 70–99)
Potassium: 4.2 mmol/L (ref 3.5–5.1)
Sodium: 139 mmol/L (ref 135–145)

## 2019-12-27 LAB — TROPONIN I (HIGH SENSITIVITY)
Troponin I (High Sensitivity): 7 ng/L (ref <18)
Troponin I (High Sensitivity): 7 ng/L (ref <18)

## 2019-12-27 MED ORDER — SODIUM CHLORIDE 0.9% FLUSH: 3.0000 mL | Freq: Once | INTRAVENOUS | Status: DC

## 2019-12-27 NOTE — ED Triage Notes (Signed)
Pt presents to ED with complaints of chest pain to center of chest since yesterday. She also endorses "stomach burning" and food tasting bad. Denies sob, dizziness, diaphoresis

## 2019-12-27 NOTE — ED Notes (Signed)
No answer for VS x 2 

## 2019-12-28 MED ORDER — PANTOPRAZOLE SODIUM 20 MG PO TBEC: 20.0000 mg | DELAYED_RELEASE_TABLET | Freq: Every day | ORAL | 2 refills | Status: AC

## 2019-12-28 MED ORDER — ALUM & MAG HYDROXIDE-SIMETH 200-200-20 MG/5ML PO SUSP
30.0000 mL | Freq: Once | ORAL | Status: AC
  Administered 2019-12-28: 30 mL via ORAL
  Filled 2019-12-28: qty 30

## 2019-12-28 MED ORDER — LIDOCAINE VISCOUS HCL 2 % MT SOLN
15.0000 mL | Freq: Once | OROMUCOSAL | Status: AC
  Administered 2019-12-28: 15 mL via ORAL
  Filled 2019-12-28: qty 15

## 2019-12-28 NOTE — ED Notes (Signed)
Please call the son Dr. Tedra Coupe at [phone] with an update

## 2019-12-28 NOTE — ED Provider Notes (Signed)
[facility] EMERGENCY DEPARTMENT Provider Note   CSN: [id number] Arrival date & time: 12/27/19  1733     History Chief Complaint  Patient presents with  . Chest Pain    [name] is a 86 y.o. female.  She is complaining of subxiphoid chest pain that radiates up into her throat and causes food to taste bad.  It sounds like this problem has been ongoing for years.  She says she is tried multiple things and they did seem to help.  Not associated with shortness of breath or diaphoresis.  Not exertional.  The history is provided by the patient.  Chest Pain Pain location:  Substernal area and epigastric Pain quality: burning   Pain radiates to:  Neck Pain severity:  Unable to specify Onset quality:  Gradual Duration: years. Timing:  Intermittent Progression:  Unchanged Chronicity:  Chronic Context: eating   Relieved by:  Nothing Exacerbated by: eating. Ineffective treatments:  None tried Associated symptoms: abdominal pain and dysphagia   Associated symptoms: no cough, no diaphoresis, no fever, no headache, no shortness of breath and no vomiting   Risk factors: no smoking        Past Medical History:  Diagnosis Date  . Diverticular disease   . GERD (gastroesophageal reflux disease)   . Hiatal hernia   . High cholesterol   . Hypertension     Patient Active Problem List   Diagnosis Date Noted  . Syncope 09/29/2012  . Hypertension 09/29/2012  . Hyperlipidemia 09/29/2012  . Anemia 09/29/2012  . Chronic abdominal pain 09/29/2012    Past Surgical History:  Procedure Laterality Date  . ABDOMINAL HYSTERECTOMY    . HYSTEROTOMY    . LIVER BIOPSY       OB History   No obstetric history on file.     No family history on file.  Social History   Tobacco Use  . Smoking status: Never Smoker  . Smokeless tobacco: Never Used  Vaping Use  . Vaping Use: Never used  Substance Use Topics  . Alcohol use: No  . Drug use: No    Home  Medications Prior to Admission medications   Medication Sig Start Date End Date Taking? Authorizing Provider  amLODipine (NORVASC) 10 MG tablet Take 10 mg by mouth daily.    [provider]  atenolol (TENORMIN) 50 MG tablet Take 50 mg by mouth daily.    [provider]  atorvastatin (LIPITOR) 80 MG tablet Take 80 mg by mouth daily.    [provider]  calcium-vitamin D (OSCAL WITH D) 500-200 MG-UNIT per tablet Take 1 tablet by mouth daily.    [provider]  cholecalciferol (VITAMIN D) 1000 UNITS tablet Take 2,000 Units by mouth daily.    [provider]  hydrALAZINE (APRESOLINE) 25 MG tablet Take 1 tablet (25 mg total) by mouth 2 (two) times daily. 10/01/12   Geoffry Paradise, MD  ibuprofen (ADVIL,MOTRIN) 800 MG tablet Take 1 tablet (800 mg total) by mouth 3 (three) times daily. Patient not taking: Reported on 07/06/2018 05/04/14   Glynn Octave, MD  magic mouthwash SOLN Take 5 mLs by mouth 3 (three) times daily as needed for mouth pain. Patient not taking: Reported on 07/06/2018 09/18/15   Alvira Monday, MD  Multiple Vitamin (MULITIVITAMIN WITH MINERALS) TABS Take 1 tablet by mouth daily.    [provider]  naproxen sodium (ALEVE) 220 MG tablet Take 220 mg by mouth daily as needed (pain).    [provider]  Omega-3 Fatty Acids (FISH OIL) 1000 MG CAPS Take 1,000 mg by mouth daily.     [provider]  valsartan (DIOVAN) 320 MG tablet Take 320 mg by mouth daily.    [provider]    Allergies    Iodine  Review of Systems   Review of Systems  Constitutional: Negative for diaphoresis and fever.  HENT: Positive for trouble swallowing. Negative for sore throat.   Eyes: Negative for visual disturbance.  Respiratory: Negative for cough and shortness of breath.   Cardiovascular: Positive for chest pain.  Gastrointestinal: Positive for abdominal pain. Negative for vomiting.  Genitourinary: Negative for  dysuria.  Musculoskeletal: Positive for neck pain.  Skin: Negative for rash.  Neurological: Negative for headaches.    Physical Exam Updated Vital Signs BP 139/65 (BP Location: Right Arm)   Pulse 83   Temp 98.4 F (36.9 C) (Oral)   Resp 17   Ht 5\' 3"  (1.6 m)   Wt 59 kg   SpO2 97%   BMI 23.04 kg/m   Physical Exam Vitals and nursing note reviewed.  Constitutional:      General: She is not in acute distress.    Appearance: She is well-developed.  HENT:     Head: Normocephalic and atraumatic.  Eyes:     Conjunctiva/sclera: Conjunctivae normal.  Cardiovascular:     Rate and Rhythm: Normal rate and regular rhythm.     Heart sounds: No murmur heard.   Pulmonary:     Effort: Pulmonary effort is normal. No respiratory distress.     Breath sounds: Normal breath sounds.  Abdominal:     Palpations: Abdomen is soft.     Tenderness: There is no abdominal tenderness.  Musculoskeletal:        General: Normal range of motion.     Cervical back: Neck supple.     Right lower leg: No tenderness. Edema present.     Left lower leg: No tenderness. Edema present.  Skin:    General: Skin is warm and dry.     Capillary Refill: Capillary refill takes less than 2 seconds.  Neurological:     General: No focal deficit present.     Mental Status: She is alert.     ED Results / Procedures / Treatments   Labs (all labs ordered are listed, but only abnormal results are displayed) Labs Reviewed  BASIC METABOLIC PANEL - Abnormal; Notable for the following components:      Result Value   Creatinine, Ser 1.15 (*)    GFR calc non Af Amer 41 (*)    GFR calc Af Amer 48 (*)    All other components within normal limits  CBC - Abnormal; Notable for the following components:   RBC 5.45 (*)    Hemoglobin 11.7 (*)    MCV 71.7 (*)    MCH 21.5 (*)    MCHC 29.9 (*)    RDW 16.4 (*)    All other components within normal limits  TROPONIN I (HIGH SENSITIVITY)  TROPONIN I (HIGH SENSITIVITY)     EKG EKG Interpretation  Date/Time:  Tuesday December 27 2019 17:40:41 EDT Ventricular Rate:  60 PR Interval:  162 QRS Duration: 80 QT Interval:  398 QTC Calculation: 398 R Axis:   51 Text Interpretation: Normal sinus rhythm Normal ECG When compared with ECG of 07/06/2018, No significant change was found Confirmed by 07/08/2018 (Dione Booze) on 12/28/2019 2:55:49 AM   Radiology DG Chest 2 View  Result Date: 12/27/2019 CLINICAL DATA:  Central chest pain since yesterday EXAM: CHEST - 2 VIEW COMPARISON:  05/04/2014 FINDINGS: Frontal and lateral views of the chest demonstrate a stable cardiac silhouette. Chronic interstitial scarring without airspace disease, effusion, or pneumothorax. No acute bony abnormalities. IMPRESSION: 1. Stable exam, no acute process. Electronically Signed   By: Sharlet Salina M.D.   On: 12/27/2019 18:21    Procedures Procedures (including critical care time)  Medications Ordered in ED Medications  alum & mag hydroxide-simeth (MAALOX/MYLANTA) 200-200-20 MG/5ML suspension 30 mL (30 mLs Oral Given 12/28/19 0909)    And  lidocaine (XYLOCAINE) 2 % viscous mouth solution 15 mL (15 mLs Oral Given 12/28/19 4481)    ED Course  I have reviewed the triage vital signs and the nursing notes.  Pertinent labs & imaging results that were available during my care of the patient were reviewed by me and considered in my medical decision making (see chart for details).  Clinical Course as of Dec 27 1841  Wed Dec 28, 2019  8563 Patient had good improvement of her discomfort with GI cocktail with lidocaine. She appears like she is taking NSAIDs from her med list. Have asked her to discontinue those and we will put her on some PPI. Recommended follow-up with her primary care doctor and return instructions discussed.   [MB]  [id number] EKG is sinus bradycardia rate of 54 normal intervals no acute ST-T changes.   [MB]  1045 Reviewed results with patient's family member.  All questions  answered.  Return instructions discussed.   [MB]    Clinical Course User Index [MB] Terrilee Files, MD   MDM Rules/Calculators/A&P                         This patient complains of epigastric chest pain and throat pain; this involves an extensive number of treatment Options and is a complaint that carries with it a high risk of complications and Morbidity. The differential includes reflux, gastritis, ACS, pneumonia, pneumothorax, vascular  I ordered, reviewed and interpreted labs, which included CBC with normal white count, stable hemoglobin, normal chemistries other than slightly elevated creatinine, baseline, negative delta troponin I ordered medication GI cocktail with improvement in her symptoms I ordered imaging studies which included chest x-ray and I independently    visualized and interpreted imaging which showed no acute infiltrates Additional history obtained from patient's family member Previous records obtained and reviewed in epic, does not appear to be on any PPIs, has taken NSAIDs in the past  After the interventions stated above, I reevaluated the patient and found patient symptoms to be improved.  No evidence of acute coronary syndrome.  Will put on PPI.  Recommended close PCP follow-up.  Return instructions discussed.   Final Clinical Impression(s) / ED Diagnoses Final diagnoses:  Nonspecific chest pain  Gastroesophageal reflux disease, unspecified whether esophagitis present    Rx / DC Orders ED Discharge Orders         Ordered    pantoprazole (PROTONIX) 20 MG tablet  Daily     Discontinue  Reprint     12/28/19 1035           Terrilee Files, MD 12/28/19 [phone]

## 2019-12-28 NOTE — Discharge Instructions (Addendum)
You were seen in the emergency department for evaluation of upper abdominal and chest pain. You had blood work EKG and a chest x-ray that did not show any evidence of a heart attack. This pain may be related to acid reflux. We are starting you on an acid medication. Please contact your primary care doctor for close follow-up. Return to the emergency department for any worsening or concerning symptoms

## 2020-01-06 DIAGNOSIS — L853 Xerosis cutis: Secondary | ICD-10-CM | POA: Insufficient documentation

## 2020-02-27 ENCOUNTER — Encounter (HOSPITAL_COMMUNITY): Payer: Self-pay | Admitting: Emergency Medicine

## 2020-02-27 ENCOUNTER — Emergency Department (HOSPITAL_COMMUNITY): Payer: Medicare Other

## 2020-02-27 ENCOUNTER — Other Ambulatory Visit: Payer: Self-pay

## 2020-02-27 ENCOUNTER — Inpatient Hospital Stay (HOSPITAL_COMMUNITY)
Admission: EM | Admit: 2020-02-27 | Discharge: 2020-03-01 | DRG: 312 | Disposition: A | Discharge status: Home Health | Payer: Medicare Other | Attending: Family Medicine | Admitting: Family Medicine

## 2020-02-27 DIAGNOSIS — D649 Anemia, unspecified: Secondary | ICD-10-CM | POA: Diagnosis present

## 2020-02-27 DIAGNOSIS — R55 Syncope and collapse: Principal | ICD-10-CM | POA: Diagnosis present

## 2020-02-27 DIAGNOSIS — Z79899 Other long term (current) drug therapy: Secondary | ICD-10-CM

## 2020-02-27 DIAGNOSIS — I509 Heart failure, unspecified: Secondary | ICD-10-CM | POA: Diagnosis present

## 2020-02-27 DIAGNOSIS — N1831 Chronic kidney disease, stage 3a: Secondary | ICD-10-CM | POA: Diagnosis present

## 2020-02-27 DIAGNOSIS — I10 Essential (primary) hypertension: Secondary | ICD-10-CM | POA: Diagnosis present

## 2020-02-27 DIAGNOSIS — Z90710 Acquired absence of both cervix and uterus: Secondary | ICD-10-CM

## 2020-02-27 DIAGNOSIS — I130 Hypertensive heart and chronic kidney disease with heart failure and stage 1 through stage 4 chronic kidney disease, or unspecified chronic kidney disease: Secondary | ICD-10-CM | POA: Diagnosis present

## 2020-02-27 DIAGNOSIS — Z888 Allergy status to other drugs, medicaments and biological substances status: Secondary | ICD-10-CM

## 2020-02-27 DIAGNOSIS — D631 Anemia in chronic kidney disease: Secondary | ICD-10-CM | POA: Diagnosis present

## 2020-02-27 DIAGNOSIS — Z20822 Contact with and (suspected) exposure to covid-19: Secondary | ICD-10-CM | POA: Diagnosis present

## 2020-02-27 DIAGNOSIS — K219 Gastro-esophageal reflux disease without esophagitis: Secondary | ICD-10-CM | POA: Diagnosis present

## 2020-02-27 DIAGNOSIS — E785 Hyperlipidemia, unspecified: Secondary | ICD-10-CM | POA: Diagnosis present

## 2020-02-27 DIAGNOSIS — R001 Bradycardia, unspecified: Secondary | ICD-10-CM | POA: Diagnosis present

## 2020-02-27 LAB — LIPASE, BLOOD: Lipase: 37 U/L (ref 11–51)

## 2020-02-27 LAB — CBC WITH DIFFERENTIAL/PLATELET
Abs Immature Granulocytes: 0.03 10*3/uL (ref 0.00–0.07)
Basophils Absolute: 0 10*3/uL (ref 0.0–0.1)
Basophils Relative: 1 %
Eosinophils Absolute: 0.1 10*3/uL (ref 0.0–0.5)
Eosinophils Relative: 1 %
HCT: 37.4 % (ref 36.0–46.0)
Hemoglobin: 11.2 g/dL — ABNORMAL LOW (ref 12.0–15.0)
Immature Granulocytes: 0 %
Lymphocytes Relative: 17 %
Lymphs Abs: 1.4 10*3/uL (ref 0.7–4.0)
MCH: 21.5 pg — ABNORMAL LOW (ref 26.0–34.0)
MCHC: 29.9 g/dL — ABNORMAL LOW (ref 30.0–36.0)
MCV: 71.6 fL — ABNORMAL LOW (ref 80.0–100.0)
Monocytes Absolute: 0.6 10*3/uL (ref 0.1–1.0)
Monocytes Relative: 7 %
Neutro Abs: 5.9 10*3/uL (ref 1.7–7.7)
Neutrophils Relative %: 74 %
Platelets: 223 10*3/uL (ref 150–400)
RBC: 5.22 MIL/uL — ABNORMAL HIGH (ref 3.87–5.11)
RDW: 16.7 % — ABNORMAL HIGH (ref 11.5–15.5)
WBC: 8 10*3/uL (ref 4.0–10.5)
nRBC: 0 % (ref 0.0–0.2)

## 2020-02-27 LAB — URINALYSIS, ROUTINE W REFLEX MICROSCOPIC
Bilirubin Urine: NEGATIVE
Glucose, UA: NEGATIVE mg/dL
Hgb urine dipstick: NEGATIVE
Ketones, ur: NEGATIVE mg/dL
Leukocytes,Ua: NEGATIVE
Nitrite: NEGATIVE
Protein, ur: NEGATIVE mg/dL
Specific Gravity, Urine: 1.008 (ref 1.005–1.030)
pH: 9 — ABNORMAL HIGH (ref 5.0–8.0)

## 2020-02-27 LAB — COMPREHENSIVE METABOLIC PANEL
ALT: 17 U/L (ref 0–44)
AST: 25 U/L (ref 15–41)
Albumin: 3.3 g/dL — ABNORMAL LOW (ref 3.5–5.0)
Alkaline Phosphatase: 73 U/L (ref 38–126)
Anion gap: 12 (ref 5–15)
BUN: 10 mg/dL (ref 8–23)
CO2: 21 mmol/L — ABNORMAL LOW (ref 22–32)
Calcium: 9.7 mg/dL (ref 8.9–10.3)
Chloride: 106 mmol/L (ref 98–111)
Creatinine, Ser: 1.05 mg/dL — ABNORMAL HIGH (ref 0.44–1.00)
GFR calc Af Amer: 53 mL/min — ABNORMAL LOW (ref >60)
GFR calc non Af Amer: 46 mL/min — ABNORMAL LOW (ref >60)
Glucose, Bld: 124 mg/dL — ABNORMAL HIGH (ref 70–99)
Potassium: 3.7 mmol/L (ref 3.5–5.1)
Sodium: 139 mmol/L (ref 135–145)
Total Bilirubin: 0.6 mg/dL (ref 0.3–1.2)
Total Protein: 7.1 g/dL (ref 6.5–8.1)

## 2020-02-27 LAB — TROPONIN I (HIGH SENSITIVITY)
Troponin I (High Sensitivity): 10 ng/L (ref <18)
Troponin I (High Sensitivity): 11 ng/L (ref <18)

## 2020-02-27 MED ORDER — SODIUM CHLORIDE 0.9 % IV SOLN: INTRAVENOUS | Status: DC

## 2020-02-27 MED ORDER — PANTOPRAZOLE SODIUM 40 MG IV SOLR
40.0000 mg | Freq: Once | INTRAVENOUS | Status: AC
  Administered 2020-02-27: 40 mg via INTRAVENOUS
  Filled 2020-02-27: qty 40

## 2020-02-27 NOTE — ED Notes (Signed)
Pt st's she needs to have BM  Pt placed on bedpan

## 2020-02-27 NOTE — ED Triage Notes (Addendum)
Pt to ED via [facility] from home after reported having a syncopal episode while sitting in a chair.  On EMS arrival pt was unresponsive but breathing on her own.  Pt c/o abd pain to EMS   EMS gave pt Zofran 4mg  IV

## 2020-02-27 NOTE — ED Notes (Signed)
Pt alert and oriented at this time.  St's she hasn't felt good but today started to feel worse.  Pt c/o aching all over

## 2020-02-27 NOTE — ED Provider Notes (Signed)
[facility] EMERGENCY DEPARTMENT Provider Note   CSN: [id number] Arrival date & time: 02/27/20  1834     History No chief complaint on file.   [name] is a 86 y.o. female.  Patient is a 86 year old female with a history of hypertension, diverticular disease, chronic abdominal pain, anemia, syncope who is presenting today with EMS.  They report they were called out today because patient was sitting in her chair when suddenly she slumped over and fell onto a carpeted floor headfirst.  Initially family did not know if she was breathing but when EMS arrived patient had radial pulses and spontaneous respirations.  While they were getting her loaded into the truck she started to home and moan but has not really been able to give any history.  Family was unable to give much more than exactly what happened prior to them calling 911.  It is unclear what her baseline is or if she has not been feeling well today.  They did report she was not at her baseline prior to leaving the house.  They did not witness any seizure-like activity. EMS reported normal vital signs and oxygen saturation.  Blood sugar of 114.  They administered no medications in route.  The history is provided by the EMS personnel and a relative. The history is limited by the condition of the patient and the absence of a caregiver.       Past Medical History:  Diagnosis Date  . Diverticular disease   . GERD (gastroesophageal reflux disease)   . Hiatal hernia   . High cholesterol   . Hypertension     Patient Active Problem List   Diagnosis Date Noted  . Syncope 09/29/2012  . Hypertension 09/29/2012  . Hyperlipidemia 09/29/2012  . Anemia 09/29/2012  . Chronic abdominal pain 09/29/2012    Past Surgical History:  Procedure Laterality Date  . ABDOMINAL HYSTERECTOMY    . HYSTEROTOMY    . LIVER BIOPSY       OB History   No obstetric history on file.     No family history on file.  Social History    Tobacco Use  . Smoking status: Never Smoker  . Smokeless tobacco: Never Used  Vaping Use  . Vaping Use: Never used  Substance Use Topics  . Alcohol use: No  . Drug use: No    Home Medications Prior to Admission medications   Medication Sig Start Date End Date Taking? Authorizing Provider  amLODipine (NORVASC) 10 MG tablet Take 10 mg by mouth daily.    [provider]  atenolol (TENORMIN) 50 MG tablet Take 50 mg by mouth daily.    [provider]  atorvastatin (LIPITOR) 80 MG tablet Take 80 mg by mouth daily.    [provider]  calcium-vitamin D (OSCAL WITH D) 500-200 MG-UNIT per tablet Take 1 tablet by mouth daily.    [provider]  cholecalciferol (VITAMIN D) 1000 UNITS tablet Take 2,000 Units by mouth daily.    [provider]  hydrALAZINE (APRESOLINE) 25 MG tablet Take 1 tablet (25 mg total) by mouth 2 (two) times daily. 10/01/12   Geoffry Paradise, MD  ibuprofen (ADVIL,MOTRIN) 800 MG tablet Take 1 tablet (800 mg total) by mouth 3 (three) times daily. Patient not taking: Reported on 07/06/2018 05/04/14   Glynn Octave, MD  losartan (COZAAR) 25 MG tablet Take 50 mg by mouth daily. 09/14/19   [provider]  magic mouthwash SOLN Take 5 mLs by  mouth 3 (three) times daily as needed for mouth pain. Patient not taking: Reported on 07/06/2018 09/18/15   Alvira Monday, MD  Multiple Vitamin (MULITIVITAMIN WITH MINERALS) TABS Take 1 tablet by mouth daily.    [provider]  naproxen sodium (ALEVE) 220 MG tablet Take 220 mg by mouth daily as needed (pain). Patient not taking: Reported on 12/28/2019    [provider]  Omega-3 Fatty Acids (FISH OIL) 1000 MG CAPS Take 1,000 mg by mouth daily.     [provider]  pantoprazole (PROTONIX) 20 MG tablet Take 1 tablet (20 mg total) by mouth daily. 12/28/19   Terrilee Files, MD  valsartan (DIOVAN) 320 MG tablet Take 320 mg by mouth daily. Patient not taking:  Reported on 12/28/2019    [provider]    Allergies    Iodine  Review of Systems   Review of Systems  Unable to perform ROS: Mental status change    Physical Exam Updated Vital Signs BP (!) 175/77 (BP Location: Right Arm)   Pulse 67   Temp 98.5 F (36.9 C) (Rectal)   Resp (!) 27   Ht 5\' 3"  (1.6 m)   Wt 59 kg   SpO2 100%   BMI 23.04 kg/m   Physical Exam Vitals and nursing note reviewed.  Constitutional:      Appearance: She is well-developed.     Comments: Patient is humming but will not open her eyes.  She is not answering any questions  HENT:     Head: Normocephalic and atraumatic.  Eyes:     Pupils: Pupils are equal, round, and reactive to light.  Neck:     Comments: C-collar in place.  Unable to discern if patient is having any neck pain Cardiovascular:     Rate and Rhythm: Normal rate and regular rhythm.     Pulses: Normal pulses.     Heart sounds: Normal heart sounds. No murmur heard.  No friction rub.  Pulmonary:     Effort: Pulmonary effort is normal.     Breath sounds: Normal breath sounds. No wheezing or rales.  Abdominal:     General: Bowel sounds are normal. There is no distension.     Palpations: Abdomen is soft.     Tenderness: There is no abdominal tenderness. There is no guarding or rebound.     Comments: Slightly decreased bowel sounds  Musculoskeletal:        General: No tenderness. Normal range of motion.     Right lower leg: No edema.     Left lower leg: No edema.     Comments: No edema  Skin:    General: Skin is warm and dry.     Capillary Refill: Capillary refill takes 2 to 3 seconds.     Findings: No rash.  Neurological:     Cranial Nerves: No cranial nerve deficit.     Comments: Able to move arms and legs but not following commands.  No notable facial droop  Psychiatric:     Comments: Not responding verbally to questions     ED Results / Procedures / Treatments   Labs (all labs ordered are listed, but only abnormal  results are displayed) Labs Reviewed  CBC WITH DIFFERENTIAL/PLATELET - Abnormal; Notable for the following components:      Result Value   RBC 5.22 (*)    Hemoglobin 11.2 (*)    MCV 71.6 (*)    MCH 21.5 (*)    MCHC 29.9 (*)  RDW 16.7 (*)    All other components within normal limits  COMPREHENSIVE METABOLIC PANEL - Abnormal; Notable for the following components:   CO2 21 (*)    Glucose, Bld 124 (*)    Creatinine, Ser 1.05 (*)    Albumin 3.3 (*)    GFR calc non Af Amer 46 (*)    GFR calc Af Amer 53 (*)    All other components within normal limits  SARS CORONAVIRUS 2 BY RT PCR (HOSPITAL ORDER, PERFORMED IN [place] [facility])  LIPASE, BLOOD  URINALYSIS, ROUTINE W REFLEX MICROSCOPIC  TROPONIN I (HIGH SENSITIVITY)  TROPONIN I (HIGH SENSITIVITY)    EKG EKG Interpretation  Date/Time:  Monday February 27 2020 18:48:40 EDT Ventricular Rate:  64 PR Interval:    QRS Duration: 89 QT Interval:  435 QTC Calculation: 449 R Axis:   8 Text Interpretation: Sinus rhythm Ventricular premature complex Left ventricular hypertrophy Borderline T abnormalities, diffuse leads No significant change since last tracing Confirmed by Gwyneth Sprout ([id number]) on 02/27/2020 7:38:46 PM   Radiology CT HEAD WO CONTRAST  Result Date: 02/27/2020 CLINICAL DATA:  Head trauma EXAM: CT HEAD WITHOUT CONTRAST TECHNIQUE: Contiguous axial images were obtained from the base of the skull through the vertex without intravenous contrast. COMPARISON:  None. FINDINGS: Brain: Normal anatomic configuration. Mild parenchymal volume loss is commensurate with the patient's age. Moderate periventricular white matter changes are present likely reflecting the sequela of small vessel ischemia. Tiny lacunar infarct noted within the right caudate nucleus. No abnormal intra or extra-axial mass lesion or fluid collection. No abnormal mass effect or midline shift. No evidence of acute intracranial hemorrhage or infarct.  Ventricular size is normal. Cerebellum unremarkable. Vascular: Unremarkable Skull: Intact Sinuses/Orbits: Paranasal sinuses are clear. Orbits are unremarkable. Other: Mastoid air cells and middle ear cavities are clear. IMPRESSION: 1. No acute intracranial abnormality. Electronically Signed   By: Helyn Numbers MD   On: 02/27/2020 21:06   DG Chest Port 1 View  Result Date: 02/27/2020 CLINICAL DATA:  86 year old female with syncope today. EXAM: PORTABLE CHEST 1 VIEW COMPARISON:  Chest radiograph 12/27/2019 and earlier. FINDINGS: Portable AP semi upright view at 1907 hours.  Stable lung volumes. Stable borderline to mild cardiomegaly. Other mediastinal contours are within normal limits. Visualized tracheal air column is within normal limits. Allowing for portable technique the lungs are clear. Stable visualized osseous structures. Negative visible bowel gas pattern. IMPRESSION: Stable mild cardiomegaly.  No acute cardiopulmonary abnormality. Electronically Signed   By: Odessa Fleming M.D.   On: 02/27/2020 19:26    Procedures Procedures (including critical care time)  Medications Ordered in ED Medications  0.9 %  sodium chloride infusion (has no administration in time range)    ED Course  I have reviewed the triage vital signs and the nursing notes.  Pertinent labs & imaging results that were available during my care of the patient were reviewed by me and considered in my medical decision making (see chart for details).    MDM Rules/Calculators/A&P                          Elderly female presenting to the emergency room today via EMS after a syncopal event.  Patient did tell EMS that she was having abdominal pain.  However here she just homes.  With palpation there is no change in patient does not appear to have more pain with palpation of her abdomen.  However patient does have  a history of chronic abdominal pain is been seen in the emergency room several times for GERD-like symptoms and upper  abdominal discomfort.  Her vital signs are within normal limits.  She does have a history of syncope however will wait for family to arrive to give further history.  Patient did fall and hit her head with possible hyperextension of the neck so patient was placed in a c-collar prior to arrival.  We will do imaging of head and neck as well as chest x-ray, EKG and labs.  8:21 PM Family member who was present during the event arrives and reports that patient did not fall out of the chair and that she had lowered her to the floor.  Patient is now more lucid and requesting that the cervical collar be removed.  She has no tenderness in her neck and the collar was removed.  Patient is now more lucid and able to give more history.  She reports she has not felt well for the last few days.  She states she felt worse today but reports she just feels sick.  She has had increased bowel movements but is not sure if they have been diarrhea.  She states she feels burning in her stomach in her mouth.  She denies any new medications.  She denies any shortness of breath or chest pain.  Family member reported when she got there today she seemed to be her normal self and then as they were looking at recipes she suddenly started saying odd things and then became unresponsive.  8:23 PM Chest x-ray today just shows stable mild cardiomegaly.  EKG shows findings consistent with LVH but no significant changes.  Labs are pending.  Patient given IV fluids at 125/h and a dose of Protonix.  She received Zofran by EMS.  10:53 PM Labs are unremarkable but UA and COVID still pending.  Orthostatic are neg however when trying to stand pt is unsteady and reports she feels dizzy.  Head Ct without acute findings.  Pt normally is able to walk without assistance and lives alone.  Will continue gentle hydration.  Will admit for syncope work up.  Last time pt has syncope was 2014 and at that time EF was 55%.  MDM Number of Diagnoses or Management  Options   Amount and/or Complexity of Data Reviewed Clinical lab tests: ordered and reviewed Tests in the radiology section of CPT: ordered and reviewed Tests in the medicine section of CPT: ordered and reviewed Decide to obtain previous medical records or to obtain history from someone other than the patient: yes Obtain history from someone other than the patient: yes Review and summarize past medical records: yes Discuss the patient with other providers: yes Independent visualization of images, tracings, or specimens: yes  Risk of Complications, Morbidity, and/or Mortality Presenting problems: high Diagnostic procedures: low Management options: low  Patient Progress Patient progress: improved      Final Clinical Impression(s) / ED Diagnoses Final diagnoses:  Syncope, unspecified syncope type    Rx / DC Orders ED Discharge Orders    None       Gwyneth SproutPlunkett, Asani Deniston, MD 02/27/20 2255

## 2020-02-27 NOTE — ED Notes (Signed)
Pt placed on purewick, states that she was soaking wet however was dry.

## 2020-02-28 ENCOUNTER — Observation Stay (HOSPITAL_COMMUNITY): Payer: Medicare Other

## 2020-02-28 ENCOUNTER — Encounter (HOSPITAL_COMMUNITY): Payer: Self-pay | Admitting: Internal Medicine

## 2020-02-28 DIAGNOSIS — I10 Essential (primary) hypertension: Secondary | ICD-10-CM | POA: Diagnosis not present

## 2020-02-28 DIAGNOSIS — R55 Syncope and collapse: Secondary | ICD-10-CM

## 2020-02-28 LAB — D-DIMER, QUANTITATIVE: D-Dimer, Quant: 9.65 ug/mL-FEU — ABNORMAL HIGH (ref 0.00–0.50)

## 2020-02-28 LAB — BASIC METABOLIC PANEL
Anion gap: 12 (ref 5–15)
BUN: 9 mg/dL (ref 8–23)
CO2: 21 mmol/L — ABNORMAL LOW (ref 22–32)
Calcium: 8.9 mg/dL (ref 8.9–10.3)
Chloride: 104 mmol/L (ref 98–111)
Creatinine, Ser: 1.03 mg/dL — ABNORMAL HIGH (ref 0.44–1.00)
GFR calc Af Amer: 54 mL/min — ABNORMAL LOW (ref >60)
GFR calc non Af Amer: 47 mL/min — ABNORMAL LOW (ref >60)
Glucose, Bld: 98 mg/dL (ref 70–99)
Potassium: 3.9 mmol/L (ref 3.5–5.1)
Sodium: 137 mmol/L (ref 135–145)

## 2020-02-28 LAB — CBC
HCT: 36.7 % (ref 36.0–46.0)
Hemoglobin: 11 g/dL — ABNORMAL LOW (ref 12.0–15.0)
MCH: 21.9 pg — ABNORMAL LOW (ref 26.0–34.0)
MCHC: 30 g/dL (ref 30.0–36.0)
MCV: 73.1 fL — ABNORMAL LOW (ref 80.0–100.0)
Platelets: 283 10*3/uL (ref 150–400)
RBC: 5.02 MIL/uL (ref 3.87–5.11)
RDW: 16.8 % — ABNORMAL HIGH (ref 11.5–15.5)
WBC: 7.9 10*3/uL (ref 4.0–10.5)
nRBC: 0 % (ref 0.0–0.2)

## 2020-02-28 LAB — SARS CORONAVIRUS 2 BY RT PCR (HOSPITAL ORDER, PERFORMED IN ~~LOC~~ HOSPITAL LAB): SARS Coronavirus 2: NEGATIVE

## 2020-02-28 MED ORDER — ONDANSETRON HCL 4 MG/2ML IJ SOLN: 4.0000 mg | Freq: Four times a day (QID) | INTRAMUSCULAR | Status: DC | PRN

## 2020-02-28 MED ORDER — SODIUM CHLORIDE 0.9 % IV SOLN: INTRAVENOUS | Status: AC

## 2020-02-28 MED ORDER — ACETAMINOPHEN 325 MG PO TABS: 650.0000 mg | ORAL_TABLET | Freq: Four times a day (QID) | ORAL | Status: DC | PRN

## 2020-02-28 MED ORDER — ACETAMINOPHEN 650 MG RE SUPP: 650.0000 mg | Freq: Four times a day (QID) | RECTAL | Status: DC | PRN

## 2020-02-28 MED ORDER — ENOXAPARIN SODIUM 30 MG/0.3ML ~~LOC~~ SOLN
30.0000 mg | Freq: Every day | SUBCUTANEOUS | Status: DC
  Administered 2020-02-28 – 2020-03-01 (×3): 30 mg via SUBCUTANEOUS
  Filled 2020-02-28 (×3): qty 0.3

## 2020-02-28 MED ORDER — ONDANSETRON HCL 4 MG PO TABS: 4.0000 mg | ORAL_TABLET | Freq: Four times a day (QID) | ORAL | Status: DC | PRN

## 2020-02-28 NOTE — ED Notes (Signed)
PT at bedside working with pt Pt denies pain at this time, denies further needs at this time Pt and son updated on plan of care

## 2020-02-28 NOTE — H&P (Signed)
History and Physical    Madison KyleDorothy Rinke YNW:[id number] DOB: [birth year] DOA: 02/27/2020  PCP: Creola Cornusso, John, MD  Patient coming from: Home.  Chief Complaint: Loss of consciousness.  HPI: [name] is a 86 y.o. female with history of hypertension, anemia, chronic kidney disease stage III previous episode of syncope in 2014 attributed to likely orthostatic at that time EF was around 55 to 60% in 2014 was brought to the ER after patient's grandson noticed patient had a sudden loss of consciousness.  EMS was called patient was found to be unconscious and regained consciousness soon and was complaining of some abdominal discomfort and was brought to the ER.  Patient at the time of my exam has become alert awake oriented and states that she was doing fine yesterday but just prior to the incident patient felt dizzy with no chest pain palpitation nausea vomiting or diarrhea and she slowly sat on the floor following which she lost consciousness.  She remembers next being in the hospital.  ED Course: In the hospital patient was not orthostatic but on trying to make a stand she was very unsteady.  CT head and MRI were unremarkable EKG shows normal sinus rhythm with QTC of 449 ms troponins were negative.  Patient was started on fluids admitted for further observation.  Labs are significant for creatinine of 1.05 and hemoglobin of 11.2 which appears to be at baseline.  Review of Systems: As per HPI, rest all negative.   Past Medical History:  Diagnosis Date  . Diverticular disease   . GERD (gastroesophageal reflux disease)   . Hiatal hernia   . High cholesterol   . Hypertension     Past Surgical History:  Procedure Laterality Date  . ABDOMINAL HYSTERECTOMY    . HYSTEROTOMY    . LIVER BIOPSY       reports that she has never smoked. She has never used smokeless tobacco. She reports that she does not drink alcohol and does not use drugs.  Allergies  Allergen Reactions  . Iodine Hives    Family  History  Family history unknown: Yes    Prior to Admission medications   Medication Sig Start Date End Date Taking? Authorizing Provider  amLODipine (NORVASC) 10 MG tablet Take 10 mg by mouth daily.    [provider]  atenolol (TENORMIN) 50 MG tablet Take 50 mg by mouth daily.    [provider]  atorvastatin (LIPITOR) 80 MG tablet Take 80 mg by mouth daily.    [provider]  calcium-vitamin D (OSCAL WITH D) 500-200 MG-UNIT per tablet Take 1 tablet by mouth daily.    [provider]  cholecalciferol (VITAMIN D) 1000 UNITS tablet Take 2,000 Units by mouth daily.    [provider]  hydrALAZINE (APRESOLINE) 25 MG tablet Take 1 tablet (25 mg total) by mouth 2 (two) times daily. 10/01/12   Geoffry ParadiseAronson, Richard, MD  ibuprofen (ADVIL,MOTRIN) 800 MG tablet Take 1 tablet (800 mg total) by mouth 3 (three) times daily. Patient not taking: Reported on 07/06/2018 05/04/14   Glynn Octaveancour, Stephen, MD  losartan (COZAAR) 25 MG tablet Take 50 mg by mouth daily. 09/14/19   [provider]  magic mouthwash SOLN Take 5 mLs by mouth 3 (three) times daily as needed for mouth pain. Patient not taking: Reported on 07/06/2018 09/18/15   Alvira MondaySchlossman, Erin, MD  Multiple Vitamin (MULITIVITAMIN WITH MINERALS) TABS Take 1 tablet by mouth daily.    [provider]  naproxen sodium (ALEVE) 220  MG tablet Take 220 mg by mouth daily as needed (pain). Patient not taking: Reported on 12/28/2019    [provider]  Omega-3 Fatty Acids (FISH OIL) 1000 MG CAPS Take 1,000 mg by mouth daily.     [provider]  pantoprazole (PROTONIX) 20 MG tablet Take 1 tablet (20 mg total) by mouth daily. 12/28/19   Terrilee Files, MD  valsartan (DIOVAN) 320 MG tablet Take 320 mg by mouth daily. Patient not taking: Reported on 12/28/2019    [provider]    Physical Exam: Constitutional: Moderately built and nourished. Vitals:   02/27/20 2230 02/27/20 2300  02/27/20 2315 02/28/20 0040  BP: (!) 144/92 (!) 170/80 (!) 168/76   Pulse: 73 73 71 73  Resp: 17 20 (!) 23 20  Temp:      TempSrc:      SpO2: 100% 100% 100% 100%  Weight:      Height:       Eyes: Anicteric no pallor. ENMT: No discharge from the ears eyes nose or mouth. Neck: No mass felt.  No neck rigidity. Respiratory: No rhonchi or crepitations. Cardiovascular: S1-S2 heard. Abdomen: Soft nontender bowel sounds present. Musculoskeletal: No edema. Skin: No rash. Neurologic: Alert awake oriented to time place and person.  Moves all extremities. Psychiatric: Appears normal.  Normal affect.   Labs on Admission: I have personally reviewed following labs and imaging studies  CBC: Recent Labs  Lab 02/27/20 1949  WBC 8.0  NEUTROABS 5.9  HGB 11.2*  HCT 37.4  MCV 71.6*  PLT 223   Basic Metabolic Panel: Recent Labs  Lab 02/27/20 1949  NA 139  K 3.7  CL 106  CO2 21*  GLUCOSE 124*  BUN 10  CREATININE 1.05*  CALCIUM 9.7   GFR: Estimated Creatinine Clearance: 27.7 mL/min (A) (by C-G formula based on SCr of 1.05 mg/dL (H)). Liver Function Tests: Recent Labs  Lab 02/27/20 1949  AST 25  ALT 17  ALKPHOS 73  BILITOT 0.6  PROT 7.1  ALBUMIN 3.3*   Recent Labs  Lab 02/27/20 1949  LIPASE 37   No results for input(s): AMMONIA in the last 168 hours. Coagulation Profile: No results for input(s): INR, PROTIME in the last 168 hours. Cardiac Enzymes: No results for input(s): CKTOTAL, CKMB, CKMBINDEX, TROPONINI in the last 168 hours. BNP (last 3 results) No results for input(s): PROBNP in the last 8760 hours. HbA1C: No results for input(s): HGBA1C in the last 72 hours. CBG: No results for input(s): GLUCAP in the last 168 hours. Lipid Profile: No results for input(s): CHOL, HDL, LDLCALC, TRIG, CHOLHDL, LDLDIRECT in the last 72 hours. Thyroid Function Tests: No results for input(s): TSH, T4TOTAL, FREET4, T3FREE, THYROIDAB in the last 72 hours. Anemia Panel: No  results for input(s): VITAMINB12, FOLATE, FERRITIN, TIBC, IRON, RETICCTPCT in the last 72 hours. Urine analysis:    Component Value Date/Time   COLORURINE STRAW (A) 02/27/2020 2301   APPEARANCEUR HAZY (A) 02/27/2020 2301   LABSPEC 1.008 02/27/2020 2301   PHURINE 9.0 (H) 02/27/2020 2301   GLUCOSEU NEGATIVE 02/27/2020 2301   HGBUR NEGATIVE 02/27/2020 2301   BILIRUBINUR NEGATIVE 02/27/2020 2301   KETONESUR NEGATIVE 02/27/2020 2301   PROTEINUR NEGATIVE 02/27/2020 2301   UROBILINOGEN 0.2 09/29/2012 1723   NITRITE NEGATIVE 02/27/2020 2301   LEUKOCYTESUR NEGATIVE 02/27/2020 2301   Sepsis Labs: @LABRCNTIP (procalcitonin:4,lacticidven:4) ) Recent Results (from the past 240 hour(s))  SARS Coronavirus 2 by RT PCR (hospital order, performed in [facility] hospital lab) Nasopharyngeal  Nasopharyngeal Swab     Status: None   Collection Time: 02/27/20 11:16 PM   Specimen: Nasopharyngeal Swab  Result Value Ref Range Status   SARS Coronavirus 2 NEGATIVE NEGATIVE Final    Comment: (NOTE) SARS-CoV-2 target nucleic acids are NOT DETECTED.  The SARS-CoV-2 RNA is generally detectable in upper and lower respiratory specimens during the acute phase of infection. The lowest concentration of SARS-CoV-2 viral copies this assay can detect is 250 copies / mL. A negative result does not preclude SARS-CoV-2 infection and should not be used as the sole basis for treatment or other patient management decisions.  A negative result may occur with improper specimen collection / handling, submission of specimen other than nasopharyngeal swab, presence of viral mutation(s) within the areas targeted by this assay, and inadequate number of viral copies (<250 copies / mL). A negative result must be combined with clinical observations, patient history, and epidemiological information.  Fact Sheet for Patients:   BoilerBrush.com.cy  Fact Sheet for Healthcare  Providers: https://pope.com/  This test is not yet approved or  cleared by the [place] FDA and has been authorized for detection and/or diagnosis of SARS-CoV-2 by FDA under an Emergency Use Authorization (EUA).  This EUA will remain in effect (meaning this test can be used) for the duration of the COVID-19 declaration under Section 564(b)(1) of the Act, 21 U.S.C. section 360bbb-3(b)(1), unless the authorization is terminated or revoked sooner.  Performed at [facility], [address], [place], [place] [address]      Radiological Exams on Admission: CT HEAD WO CONTRAST  Result Date: 02/27/2020 CLINICAL DATA:  Head trauma EXAM: CT HEAD WITHOUT CONTRAST TECHNIQUE: Contiguous axial images were obtained from the base of the skull through the vertex without intravenous contrast. COMPARISON:  None. FINDINGS: Brain: Normal anatomic configuration. Mild parenchymal volume loss is commensurate with the patient's age. Moderate periventricular white matter changes are present likely reflecting the sequela of small vessel ischemia. Tiny lacunar infarct noted within the right caudate nucleus. No abnormal intra or extra-axial mass lesion or fluid collection. No abnormal mass effect or midline shift. No evidence of acute intracranial hemorrhage or infarct. Ventricular size is normal. Cerebellum unremarkable. Vascular: Unremarkable Skull: Intact Sinuses/Orbits: Paranasal sinuses are clear. Orbits are unremarkable. Other: Mastoid air cells and middle ear cavities are clear. IMPRESSION: 1. No acute intracranial abnormality. Electronically Signed   By: Helyn Numbers MD   On: 02/27/2020 21:06   DG Chest Port 1 View  Result Date: 02/27/2020 CLINICAL DATA:  86 year old female with syncope today. EXAM: PORTABLE CHEST 1 VIEW COMPARISON:  Chest radiograph 12/27/2019 and earlier. FINDINGS: Portable AP semi upright view at 1907 hours.  Stable lung volumes. Stable borderline to mild  cardiomegaly. Other mediastinal contours are within normal limits. Visualized tracheal air column is within normal limits. Allowing for portable technique the lungs are clear. Stable visualized osseous structures. Negative visible bowel gas pattern. IMPRESSION: Stable mild cardiomegaly.  No acute cardiopulmonary abnormality. Electronically Signed   By: Odessa Fleming M.D.   On: 02/27/2020 19:26    EKG: Independently reviewed.  Sinus rhythm.  Assessment/Plan Principal Problem:   Syncope Active Problems:   Hypertension   Anemia    1. Syncope cause not clear.  Patient was feeling dizzy prior to the incident.  On trying to check orthostatic patient was not orthostatic but felt dizzy on trying to stand but MRI brain is negative for anything acute.  We will continue to monitor in telemetry.  Physical therapy consult check 2D echo.  Will check D-dimer. 2. Hypertension Home medication needs to be verified for now I kept patient on as needed IV hydralazine. 3. Chronic kidney disease stage III creatinine appears to be at baseline when compared to the one in July 2021. 4. Chronic anemia likely from renal disease follow CBC.   DVT prophylaxis: Lovenox. Code Status: Full code. Family Communication: We will need to discuss with patient's grandson with whom patient lives when available in the morning. Disposition Plan: Home. Consults called: Physical therapy. Admission status: Observation.   Eduard Clos MD [facility] Pager [phone].  If 7PM-7AM, please contact night-coverage [email or web address] Password [email or web address]  02/28/2020, 1:24 AM

## 2020-02-28 NOTE — Progress Notes (Signed)
  Echocardiogram 2D Echocardiogram has been performed.  IVETH HEIDEMANN 02/28/2020, 11:48 AM

## 2020-02-28 NOTE — Progress Notes (Signed)
86 year old lady with prior h/o hypertension, stage 3 a CKD, presents with syncope.   Pt was admitted earlier this am by Dr Toniann Fail, please see his noted for detailed H&P.   CT head and MRI brain without contrast ordered and is negative for acute CVA.   Pt seen and examined with son at bedside.   She is alert and oriented to place and person.  She reports left lateral chest wall pain. Appears comfortable,    Plan:  1. Orthostatic vital signs, echocardiogram;  2. PT evaluation.    Kathlen Mody, MD

## 2020-02-28 NOTE — ED Notes (Signed)
Pt transported to MRI 

## 2020-02-28 NOTE — Progress Notes (Signed)
Pt was seen for mobility on no AD, but per son was not using a cane or walker previously.  However, she now is getting unsteady to just stand.  BP was monitored for ck of orthostatics:  Supine 172/73 pulse 58;  Sitting 170/88 pulse 60;  Standing 169/88 pulse 73.  Will anticipate rehab needs based on her functional decline, living situation and lack of confirmed family assistance.  Follow acutely for the mobility needs of LE strengthening, balance changes and low gait endurance. Family and pt are in agreement that follow up rehab is needed.    02/28/20 1200  PT Visit Information  Last PT Received On 02/28/20  Assistance Needed +1 (2 for chair to follow for longer walk)  History of Present Illness 86 yo female with onset of syncopal episode at home is admitted through ED to evaluate.  Had abd discomfort, had dizziness previously but now is not orthostatic or in pain.  PMHx:  diverticulitis, GERD, hiatal hernia, HLD, HTN, cardiomegaly, CKD 3, anemia  Precautions  Precautions Fall  Precaution Comments elevated BP's  Restrictions  Weight Bearing Restrictions No  Home Living  Family/patient expects to be discharged to: Private residence  Living Arrangements Alone  Available Help at Discharge Family;Available PRN/intermittently  Type of Home House  Home Access Stairs to enter  Entrance Stairs-Number of Steps 2 + 2  Entrance Stairs-Rails None  Home Layout Two level  Information systems manager  Home Equipment None  Additional Comments accompanied by her son  Prior Function  Level of Independence Independent  Comments has nearby family who check on her  Communication  Communication No difficulties  Pain Assessment  Pain Assessment No/denies pain  Cognition  Arousal/Alertness Awake/alert  Behavior During Therapy Flat affect  Overall Cognitive Status Within Functional Limits for tasks assessed  General Comments following instructions for mobility wiht time to process  Upper Extremity  Assessment  Upper Extremity Assessment Generalized weakness  Lower Extremity Assessment  Lower Extremity Assessment Generalized weakness  Cervical / Trunk Assessment  Cervical / Trunk Assessment Kyphotic  Bed Mobility  Overal bed mobility Needs Assistance  Bed Mobility Supine to Sit;Sit to Supine  Supine to sit Min assist  Sit to supine Min guard;Min assist  General bed mobility comments min assist to support trunk and min guard to steady before returning to bed  Transfers  Overall transfer level Needs assistance  Equipment used 1 person hand held assist;Rolling walker (2 wheeled)  Transfers Sit to/from Stand  Sit to Stand Min assist;From elevated surface  General transfer comment pt is at higher bed due to being on gurney and is awkward height for her  Ambulation/Gait  Ambulation/Gait assistance Min assist;Min guard  Gait Distance (Feet) 150 Feet  Assistive device 1 person hand held assist  Gait Pattern/deviations Step-through pattern;Narrow base of support;Ataxic  General Gait Details has moments of stumbling and listing laterally  Gait velocity reduced  Gait velocity interpretation <1.31 ft/sec, indicative of household ambulator  Balance  Overall balance assessment Needs assistance  Sitting-balance support Feet supported;Bilateral upper extremity supported  Sitting balance-Leahy Scale Fair  Standing balance support Single extremity supported  Standing balance-Leahy Scale Poor  Standing balance comment requires some support to balance upright  General Comments  General comments (skin integrity, edema, etc.) pt is up to walk with help, has unsteadiness with low endurance, and is agreeable to go to rehab if this can be arranged.  Exercises  Exercises Other exercises (LE strength is 4 to 4+)  PT -  End of Session  Activity Tolerance Patient limited by fatigue;Treatment limited secondary to medical complications (Comment)  Patient left in bed;with call bell/phone within  reach;with family/visitor present;with nursing/sitter in room  Nurse Communication Mobility status;Other (comment) (monitored orthostatics)  PT Assessment  PT Recommendation/Assessment Patient needs continued PT services  PT Visit Diagnosis Unsteadiness on feet (R26.81);Muscle weakness (generalized) (M62.81);Adult, failure to thrive (R62.7)  PT Problem List Decreased strength;Decreased activity tolerance;Decreased balance;Decreased coordination;Decreased mobility;Decreased knowledge of use of DME;Cardiopulmonary status limiting activity  Barriers to Discharge Inaccessible home environment  PT Plan  PT Frequency (ACUTE ONLY) Min 3X/week  PT Treatment/Interventions (ACUTE ONLY) Gait training;Stair training;Functional mobility training;Therapeutic activities;Therapeutic exercise;Balance training;Neuromuscular re-education;Patient/family education;DME instruction  AM-PAC PT "6 Clicks" Mobility Outcome Measure (Version 2)  Help needed turning from your back to your side while in a flat bed without using bedrails? 3  Help needed moving from lying on your back to sitting on the side of a flat bed without using bedrails? 3  Help needed moving to and from a bed to a chair (including a wheelchair)? 3  Help needed standing up from a chair using your arms (e.g., wheelchair or bedside chair)? 3  Help needed to walk in hospital room? 3  Help needed climbing 3-5 steps with a railing?  1  6 Click Score 16  Consider Recommendation of Discharge To: Home with [facility]  PT Recommendation  Follow Up Recommendations SNF  PT equipment None recommended by PT  Individuals Consulted  Consulted and Agree with Results and Recommendations Family member/caregiver;Patient  Family Member Consulted son  Acute Rehab PT Goals  Patient Stated Goal to get stronger and go home  PT Goal Formulation With patient/family  Time For Goal Achievement 03/13/20  Potential to Achieve Goals Good  PT Time Calculation  PT Start Time (ACUTE  ONLY) 1150  PT Stop Time (ACUTE ONLY) 1219  PT Time Calculation (min) (ACUTE ONLY) 29 min  PT General Charges  $$ ACUTE PT VISIT 1 Visit  PT Evaluation  $PT Eval Moderate Complexity 1 Mod  PT Treatments  $Gait Training 8-22 mins  Written Expression  Dominant Hand Right    Samul Dada, PT MS Acute Rehab Dept. Number: [facility] [id number] and [facility] [phone]

## 2020-02-28 NOTE — ED Notes (Signed)
Pt resting comfortably in bed. Denies pain at this time Respirations are even and non-labored, does not appear in distress Son is at bedside

## 2020-02-28 NOTE — ED Notes (Addendum)
Son requested that mother gets told that he was here tonight to check on her and that he would be back at 7 AM. Also that if there were any changes in condition to call and let him know.

## 2020-02-29 ENCOUNTER — Other Ambulatory Visit: Payer: Self-pay

## 2020-02-29 ENCOUNTER — Inpatient Hospital Stay (HOSPITAL_COMMUNITY): Payer: Medicare Other

## 2020-02-29 DIAGNOSIS — I130 Hypertensive heart and chronic kidney disease with heart failure and stage 1 through stage 4 chronic kidney disease, or unspecified chronic kidney disease: Secondary | ICD-10-CM | POA: Diagnosis present

## 2020-02-29 DIAGNOSIS — Z20822 Contact with and (suspected) exposure to covid-19: Secondary | ICD-10-CM | POA: Diagnosis present

## 2020-02-29 DIAGNOSIS — Z79899 Other long term (current) drug therapy: Secondary | ICD-10-CM | POA: Diagnosis not present

## 2020-02-29 DIAGNOSIS — R001 Bradycardia, unspecified: Secondary | ICD-10-CM | POA: Diagnosis present

## 2020-02-29 DIAGNOSIS — D631 Anemia in chronic kidney disease: Secondary | ICD-10-CM | POA: Diagnosis present

## 2020-02-29 DIAGNOSIS — E785 Hyperlipidemia, unspecified: Secondary | ICD-10-CM | POA: Diagnosis present

## 2020-02-29 DIAGNOSIS — R7989 Other specified abnormal findings of blood chemistry: Secondary | ICD-10-CM | POA: Diagnosis not present

## 2020-02-29 DIAGNOSIS — I509 Heart failure, unspecified: Secondary | ICD-10-CM | POA: Diagnosis present

## 2020-02-29 DIAGNOSIS — N1831 Chronic kidney disease, stage 3a: Secondary | ICD-10-CM | POA: Diagnosis present

## 2020-02-29 DIAGNOSIS — R55 Syncope and collapse: Secondary | ICD-10-CM | POA: Diagnosis present

## 2020-02-29 DIAGNOSIS — I10 Essential (primary) hypertension: Secondary | ICD-10-CM | POA: Diagnosis not present

## 2020-02-29 DIAGNOSIS — Z90710 Acquired absence of both cervix and uterus: Secondary | ICD-10-CM | POA: Diagnosis not present

## 2020-02-29 DIAGNOSIS — Z888 Allergy status to other drugs, medicaments and biological substances status: Secondary | ICD-10-CM | POA: Diagnosis not present

## 2020-02-29 DIAGNOSIS — K219 Gastro-esophageal reflux disease without esophagitis: Secondary | ICD-10-CM | POA: Diagnosis present

## 2020-02-29 LAB — ECHOCARDIOGRAM COMPLETE
AR max vel: 1.93 cm2
AV Area VTI: 2.07 cm2
AV Area mean vel: 2 cm2
AV Mean grad: 5 mmHg
AV Peak grad: 9.7 mmHg
Ao pk vel: 1.56 m/s
Area-P 1/2: 2.16 cm2
Height: 63 in
S' Lateral: 2.7 cm
Weight: 2081.14 oz

## 2020-02-29 MED ORDER — ADULT MULTIVITAMIN W/MINERALS CH
1.0000 | ORAL_TABLET | Freq: Every day | ORAL | Status: DC
  Administered 2020-03-01: 1 via ORAL
  Filled 2020-02-29: qty 1

## 2020-02-29 MED ORDER — HYDRALAZINE HCL 25 MG PO TABS
25.0000 mg | ORAL_TABLET | Freq: Two times a day (BID) | ORAL | Status: DC
  Administered 2020-02-29 (×2): 25 mg via ORAL
  Filled 2020-02-29 (×4): qty 1

## 2020-02-29 MED ORDER — PREDNISONE 50 MG PO TABS
50.0000 mg | ORAL_TABLET | Freq: Four times a day (QID) | ORAL | Status: AC
  Administered 2020-02-29 – 2020-03-01 (×3): 50 mg via ORAL
  Filled 2020-02-29 (×3): qty 1

## 2020-02-29 MED ORDER — BOOST / RESOURCE BREEZE PO LIQD CUSTOM
1.0000 | Freq: Three times a day (TID) | ORAL | Status: DC
  Administered 2020-02-29 – 2020-03-01 (×3): 1 via ORAL

## 2020-02-29 MED ORDER — ATORVASTATIN CALCIUM 80 MG PO TABS
80.0000 mg | ORAL_TABLET | Freq: Every day | ORAL | Status: DC
  Administered 2020-02-29 – 2020-03-01 (×2): 80 mg via ORAL
  Filled 2020-02-29 (×2): qty 1

## 2020-02-29 MED ORDER — DIPHENHYDRAMINE HCL 25 MG PO CAPS
50.0000 mg | ORAL_CAPSULE | Freq: Once | ORAL | Status: AC
  Administered 2020-03-01: 50 mg via ORAL
  Filled 2020-02-29: qty 2

## 2020-02-29 MED ORDER — DIPHENHYDRAMINE HCL 50 MG/ML IJ SOLN: 50.0000 mg | Freq: Once | INTRAMUSCULAR | Status: AC

## 2020-02-29 MED ORDER — LOSARTAN POTASSIUM 50 MG PO TABS
50.0000 mg | ORAL_TABLET | Freq: Every day | ORAL | Status: DC
  Administered 2020-02-29 – 2020-03-01 (×2): 50 mg via ORAL
  Filled 2020-02-29 (×2): qty 1

## 2020-02-29 MED ORDER — AMLODIPINE BESYLATE 10 MG PO TABS
10.0000 mg | ORAL_TABLET | Freq: Every day | ORAL | Status: DC
  Administered 2020-02-29 – 2020-03-01 (×2): 10 mg via ORAL
  Filled 2020-02-29 (×2): qty 1

## 2020-02-29 NOTE — Plan of Care (Signed)
  Problem: Pain Managment: Goal: General experience of comfort will improve Outcome: Progressing   Problem: Safety: Goal: Ability to remain free from injury will improve Outcome: Progressing   

## 2020-02-29 NOTE — Progress Notes (Addendum)
Initial Nutrition Assessment  DOCUMENTATION CODES:   Not applicable  INTERVENTION:   -Boost Breeze po TID, each supplement provides 250 kcal and 9 grams of protein -MVI with minerals daily  NUTRITION DIAGNOSIS:   Inadequate oral intake related to decreased appetite as evidenced by per patient/family report.  GOAL:   Patient will meet greater than or equal to 90% of their needs  MONITOR:   Supplement acceptance, PO intake, Labs, Weight trends, Skin, I & O's  REASON FOR ASSESSMENT:   Consult Assessment of nutrition requirement/status  ASSESSMENT:   86 year old female with history of hypertension, stage IIIa CKD, anemia presented with syncope.  [name] admitted with syncope.   Reviewed I/O's: -260 ml x 24 hours and +394 ml since admission  UOP: 500 ml x 24 hours  Spoke with [name] and son at bedside. Son reports he requested RD consult as he was concerned about [name] losing weight and muscle mass while in the hospital. Per son, [name] is active and independent at baseline- lives alone with assistance of her Ivor Costa and still cooks and shops for groceries herself.   [name] reports that she usually has a great appetite, consuming 3 meals per day ([name] reports he ate beans and cornbread for several meals PTA). Since being admitted, her appetite has declined. [name] reports she consumed less than half of her lunch (some [place], a few bites of mashed potatoes, and fruit). [name] has top dentures that she has access to while eating; she denies any difficulty chewing or swallowing foods.   [name] endorses weight loss, but unsure of UBW of how much weight she has lost. She notes that her legs and arms are smaller. Per wt hx, wt has been stable over the past year, however, noted history of distant weight loss.   Discussed importance of good meal and supplement intake to promote healing. [name] reports she does not like Ensure supplements. Reviewed supplement options on formulary. RD provided [name] with Boost Breeze to  try and consumed multiple sips during visit. [name] and son amenable to this supplement. Also discussed how they can purchase for home use.  ' Medications reviewed and include prednisone.   Labs reviewed.   NUTRITION - FOCUSED PHYSICAL EXAM:    Most Recent Value  Orbital Region Mild depletion  Upper Arm Region Mild depletion  Thoracic and Lumbar Region No depletion  Buccal Region No depletion  Temple Region Moderate depletion  Clavicle Bone Region Mild depletion  Clavicle and Acromion Bone Region Mild depletion  Scapular Bone Region Mild depletion  Dorsal Hand No depletion  Patellar Region No depletion  Anterior Thigh Region No depletion  Posterior Calf Region No depletion  Edema (RD Assessment) Mild  Hair Reviewed  Eyes Reviewed  Mouth Reviewed  Skin Reviewed  Nails Reviewed       Diet Order:   Diet Order            Diet Heart Room service appropriate? Yes; Fluid consistency: Thin  Diet effective now                 EDUCATION NEEDS:   Education needs have been addressed  Skin:  Skin Assessment: Reviewed RN Assessment  Last BM:  02/28/20  Height:   Ht Readings from Last 1 Encounters:  02/28/20 5\' 3"  (1.6 m)    Weight:   Wt Readings from Last 1 Encounters:  02/29/20 60.8 kg    Ideal Body Weight:  52.3 kg  BMI:  Body mass index is 23.74 kg/m.  Estimated Nutritional Needs:   Kcal:  1650-1850  Protein:  75-90 grams  Fluid:  > 1.6 L    Levada Schilling, RD, LDN, CDCES Registered Dietitian II Certified Diabetes Care and Education Specialist Please refer to [facility] for RD and/or RD on-call/weekend/after hours pager

## 2020-02-29 NOTE — Progress Notes (Signed)
[facility]  PROGRESS NOTE  [name] URK:[id number] DOB: [birth year] DOA: 02/27/2020 PCP: [name], MD   Brief HPI:   86 year old female with history of hypertension, stage IIIa CKD, anemia presented with syncope.  CT head and MRI brain were unremarkable.  EKG showed normal sinus rhythm.  Her home medications for hypertension were held for questionable orthostasis.    Subjective   Patient seen and examined, continues to have episodes of bradycardia in the hospital.  Telemetry monitoring showed heart rate of 48.  Patient was taking atenolol 50 mg daily at home.  Atenolol is currently on hold.  D-dimer significantly elevated at 9.65   Assessment/Plan:     1. Syncope-   ? Cause,  orthostatic vital signs are normal.  2D echo shows EF 60 to 65%, grade 1 diastolic dysfunction.  She does have bradycardia seen on telemetry, she was taking atenolol at home which is currently on hold.  D-dimer is elevated at 9.65.  Check CTA chest to rule out pulmonary embolism.  Continue telemetry monitoring. 2. Hypertension-blood pressure is now elevated, restart home medications including amlodipine, losartan, hydralazine.  Continue to hold atenolol.  Will monitor patient blood pressure closely. 3. CKD stage IIIa-creatinine 1.05, at baseline. 4. Elevated D-dimer-D-dimer elevated at 9.65, CTA chest ordered as above.     COVID-19 Labs  Recent Labs    02/28/20 1155  DDIMER 9.65*    Lab Results  Component Value Date   SARSCOV2NAA NEGATIVE 02/27/2020     Scheduled medications:   . amLODipine  10 mg Oral Daily  . atorvastatin  80 mg Oral Daily  . enoxaparin (LOVENOX) injection  30 mg Subcutaneous Daily  . hydrALAZINE  25 mg Oral BID  . losartan  50 mg Oral Daily         SpO2: 98 %    CBC: Recent Labs  Lab 02/27/20 1949 02/28/20 0643  WBC 8.0 7.9  NEUTROABS 5.9  --   HGB 11.2* 11.0*  HCT 37.4 36.7  MCV 71.6* 73.1*  PLT 223 283    Basic Metabolic Panel: Recent Labs   Lab 02/27/20 1949 02/28/20 0643  NA 139 137  K 3.7 3.9  CL 106 104  CO2 21* 21*  GLUCOSE 124* 98  BUN 10 9  CREATININE 1.05* 1.03*  CALCIUM 9.7 8.9     Liver Function Tests: Recent Labs  Lab 02/27/20 1949  AST 25  ALT 17  ALKPHOS 73  BILITOT 0.6  PROT 7.1  ALBUMIN 3.3*     Antibiotics: Anti-infectives (From admission, onward)   None       DVT prophylaxis: Lovenox  Code Status: Full code  Family Communication: No family at bedside    Status is: Inpatient  Dispo: The patient is from: Home              Anticipated d/c is to: Home              Anticipated d/c date is 03/01/2020              Patient currently not medically stable for discharge  Barrier to discharge-ongoing work-up for syncope     Consultants:  None  Procedures:  None   Objective   Vitals:   02/29/20 0411 02/29/20 0724 02/29/20 0926 02/29/20 0926  BP: (!) 145/92 (!) 200/91 (!) 172/8 (!) 172/80  Pulse: 63 60  61  Resp: 17 20    Temp: 98.2 F (36.8 C) 98.2 F (36.8 C)  TempSrc: Oral Oral    SpO2: 100% 98%    Weight:      Height:        Intake/Output Summary (Last 24 hours) at 02/29/2020 1106 Last data filed at 02/29/2020 0900 Gross per 24 hour  Intake 480 ml  Output 500 ml  Net -20 ml    09/13 1901 - 09/15 0700 In: 893.9 [P.O.:240; I.V.:653.9] Out: 500 [Urine:500]  Filed Weights   02/27/20 1902 02/28/20 2150 02/29/20 0030  Weight: 59 kg 61.7 kg 60.8 kg    Physical Examination:    General: Appears in no acute distress  Cardiovascular: S1-S2, regular, no murmur auscultated  Respiratory: Clear to auscultation bilaterally, no wheezing or crackles auscultated  Abdomen: Abdomen is soft, nontender, no organomegaly  Extremities: No edema in the lower extremities  Neurologic: Alert, oriented x3, intact insight and judgment, no focal deficit noted.    Data Reviewed:   Recent Results (from the past 240 hour(s))  SARS Coronavirus 2 by RT PCR (hospital  order, [facility]) Nasopharyngeal Nasopharyngeal Swab     Status: None   Collection Time: 02/27/20 11:16 PM   Specimen: Nasopharyngeal Swab  Result Value Ref Range Status   SARS Coronavirus 2 NEGATIVE NEGATIVE Final    Comment: (NOTE) SARS-CoV-2 target nucleic acids are NOT DETECTED.  The SARS-CoV-2 RNA is generally detectable in upper and lower respiratory specimens during the acute phase of infection. The lowest concentration of SARS-CoV-2 viral copies this assay can detect is 250 copies / mL. A negative result does not preclude SARS-CoV-2 infection and should not be used as the sole basis for treatment or other patient management decisions.  A negative result may occur with improper specimen collection / handling, submission of specimen other than nasopharyngeal swab, presence of viral mutation(s) within the areas targeted by this assay, and inadequate number of viral copies (<250 copies / mL). A negative result must be combined with clinical observations, patient history, and epidemiological information.  Fact Sheet for Patients:   BoilerBrush.com.cyhttps://www.fda.gov/media/136312/download  Fact Sheet for Healthcare Providers: https://pope.com/https://www.fda.gov/media/136313/download  This test is not yet approved or  cleared by the [place] FDA and has been authorized for detection and/or diagnosis of SARS-CoV-2 by FDA under an Emergency Use Authorization (EUA).  This EUA will remain in effect (meaning this test can be used) for the duration of the COVID-19 declaration under Section 564(b)(1) of the Act, 21 U.S.C. section 360bbb-3(b)(1), unless the authorization is terminated or revoked sooner.  Performed at [facility], [address] [place], [place] [address]     Recent Labs  Lab 02/27/20 1949  LIPASE 37   No results for input(s): AMMONIA in the last 168 hours.  Cardiac Enzymes: No results for input(s): CKTOTAL, CKMB, CKMBINDEX, TROPONINI in the last 168  hours. BNP (last 3 results) No results for input(s): BNP in the last 8760 hours.  ProBNP (last 3 results) No results for input(s): PROBNP in the last 8760 hours.  Studies:  CT HEAD WO CONTRAST  Result Date: 02/27/2020 CLINICAL DATA:  Head trauma EXAM: CT HEAD WITHOUT CONTRAST TECHNIQUE: Contiguous axial images were obtained from the base of the skull through the vertex without intravenous contrast. COMPARISON:  None. FINDINGS: Brain: Normal anatomic configuration. Mild parenchymal volume loss is commensurate with the patient's age. Moderate periventricular white matter changes are present likely reflecting the sequela of small vessel ischemia. Tiny lacunar infarct noted within the right caudate nucleus. No abnormal intra or extra-axial mass lesion  or fluid collection. No abnormal mass effect or midline shift. No evidence of acute intracranial hemorrhage or infarct. Ventricular size is normal. Cerebellum unremarkable. Vascular: Unremarkable Skull: Intact Sinuses/Orbits: Paranasal sinuses are clear. Orbits are unremarkable. Other: Mastoid air cells and middle ear cavities are clear. IMPRESSION: 1. No acute intracranial abnormality. Electronically Signed   By: Helyn Numbers MD   On: 02/27/2020 21:06   MR BRAIN WO CONTRAST  Result Date: 02/28/2020 CLINICAL DATA:  Mental status change with unknown cause EXAM: MRI HEAD WITHOUT CONTRAST TECHNIQUE: Multiplanar, multiecho pulse sequences of the brain and surrounding structures were obtained without intravenous contrast. COMPARISON:  Head CT from yesterday FINDINGS: Brain: No acute infarction, hemorrhage, hydrocephalus, extra-axial collection or mass lesion. Volume loss in keeping with aging. Mild for age chronic small vessel ischemic change in the deep white matter. Vascular: Normal flow voids Skull and upper cervical spine: Normal marrow signal Sinuses/Orbits: Bilateral cataract resection.  No acute finding IMPRESSION: Age congruent brain MRI.  No acute or  reversible findings. Electronically Signed   By: Marnee Spring M.D.   On: 02/28/2020 04:23   DG Chest Port 1 View  Result Date: 02/27/2020 CLINICAL DATA:  86 year old female with syncope today. EXAM: PORTABLE CHEST 1 VIEW COMPARISON:  Chest radiograph 12/27/2019 and earlier. FINDINGS: Portable AP semi upright view at 1907 hours.  Stable lung volumes. Stable borderline to mild cardiomegaly. Other mediastinal contours are within normal limits. Visualized tracheal air column is within normal limits. Allowing for portable technique the lungs are clear. Stable visualized osseous structures. Negative visible bowel gas pattern. IMPRESSION: Stable mild cardiomegaly.  No acute cardiopulmonary abnormality. Electronically Signed   By: Odessa Fleming M.D.   On: 02/27/2020 19:26   ECHOCARDIOGRAM COMPLETE  Result Date: 02/29/2020    ECHOCARDIOGRAM REPORT   Patient Name:   CLOVER FEEHAN Date of Exam: 02/28/2020 Medical Rec #:  [id number]   Height:       63.0 in Accession #:    [id number]  Weight:       130.1 lb Date of Birth:  29-Apr-1925   BSA:          1.611 m Patient Age:    89 years    BP:           153/75 mmHg Patient Gender: F           HR:           57 bpm. Exam Location:  Inpatient Procedure: 2D Echo, Cardiac Doppler and Color Doppler Indications:     Syncope  History:         Patient has prior history of Echocardiogram examinations, most                  recent 09/30/2012. Risk Factors:Hypertension and Dyslipidemia.                  CKD.  Sonographer:     Ross Ludwig RDCS (AE) Referring Phys:  [id number] [facility] Diagnosing Phys: Armanda Magic MD IMPRESSIONS  1. Left ventricular ejection fraction, by estimation, is 60 to 65%. The left ventricle has normal function. The left ventricle has no regional wall motion abnormalities. There is mild concentric left ventricular hypertrophy. Left ventricular diastolic parameters are consistent with Grade I diastolic dysfunction (impaired relaxation).  2. Right ventricular systolic  function is normal. The right ventricular size is normal.  3. The mitral valve is normal in structure. Trivial mitral valve regurgitation. No evidence of mitral stenosis.  4.  The aortic valve is tricuspid. Aortic valve regurgitation is not visualized. Mild to moderate aortic valve sclerosis/calcification is present, without any evidence of aortic stenosis.  5. The inferior vena cava is normal in size with greater than 50% respiratory variability, suggesting right atrial pressure of 3 mmHg. FINDINGS  Left Ventricle: Left ventricular ejection fraction, by estimation, is 60 to 65%. The left ventricle has normal function. The left ventricle has no regional wall motion abnormalities. The left ventricular internal cavity size was normal in size. There is  mild concentric left ventricular hypertrophy. Left ventricular diastolic parameters are consistent with Grade I diastolic dysfunction (impaired relaxation). Right Ventricle: The right ventricular size is normal. No increase in right ventricular wall thickness. Right ventricular systolic function is normal. Left Atrium: Left atrial size was normal in size. Right Atrium: Right atrial size was normal in size. Pericardium: There is no evidence of pericardial effusion. Mitral Valve: The mitral valve is normal in structure. There is mild thickening of the mitral valve leaflet(s). Mild mitral annular calcification. Trivial mitral valve regurgitation. No evidence of mitral valve stenosis. MV peak gradient, 3.8 mmHg. The mean mitral valve gradient is 1.0 mmHg. Tricuspid Valve: The tricuspid valve is normal in structure. Tricuspid valve regurgitation is not demonstrated. No evidence of tricuspid stenosis. Aortic Valve: The aortic valve is tricuspid. Aortic valve regurgitation is not visualized. Mild to moderate aortic valve sclerosis/calcification is present, without any evidence of aortic stenosis. Aortic valve mean gradient measures 5.0 mmHg. Aortic valve peak gradient measures  9.7 mmHg. Aortic valve area, by VTI measures 2.07 cm. Pulmonic Valve: The pulmonic valve was normal in structure. Pulmonic valve regurgitation is not visualized. No evidence of pulmonic stenosis. Aorta: The aortic root is normal in size and structure. Venous: The inferior vena cava is normal in size with greater than 50% respiratory variability, suggesting right atrial pressure of 3 mmHg. IAS/Shunts: No atrial level shunt detected by color flow Doppler.  LEFT VENTRICLE PLAX 2D LVIDd:         3.80 cm  Diastology LVIDs:         2.70 cm  LV e' medial:    5.44 cm/s LV PW:         1.20 cm  LV E/e' medial:  10.6 LV IVS:        1.10 cm  LV e' lateral:   5.87 cm/s LVOT diam:     2.00 cm  LV E/e' lateral: 9.8 LV SV:         72 LV SV Index:   45 LVOT Area:     3.14 cm  RIGHT VENTRICLE            IVC RV Basal diam:  2.90 cm    IVC diam: 0.90 cm RV S prime:     8.55 cm/s TAPSE (M-mode): 2.5 cm LEFT ATRIUM             Index       RIGHT ATRIUM           Index LA diam:        4.10 cm 2.55 cm/m  RA Area:     13.30 cm LA Vol (A2C):   44.8 ml 27.82 ml/m RA Volume:   32.40 ml  20.12 ml/m LA Vol (A4C):   56.7 ml 35.21 ml/m LA Biplane Vol: 51.2 ml 31.79 ml/m  AORTIC VALVE AV Area (Vmax):    1.93 cm AV Area (Vmean):   2.00 cm AV Area (VTI):  2.07 cm AV Vmax:           156.00 cm/s AV Vmean:          97.900 cm/s AV VTI:            0.347 m AV Peak Grad:      9.7 mmHg AV Mean Grad:      5.0 mmHg LVOT Vmax:         96.00 cm/s LVOT Vmean:        62.200 cm/s LVOT VTI:          0.229 m LVOT/AV VTI ratio: 0.66  AORTA Ao Root diam: 3.20 cm MITRAL VALVE MV Area (PHT): 2.16 cm    SHUNTS MV Peak grad:  3.8 mmHg    Systemic VTI:  0.23 m MV Mean grad:  1.0 mmHg    Systemic Diam: 2.00 cm MV Vmax:       0.98 m/s MV Vmean:      54.3 cm/s MV Decel Time: 352 msec MV E velocity: 57.40 cm/s MV A velocity: 89.50 cm/s MV E/A ratio:  0.64 Armanda Magic MD Electronically signed by Armanda Magic MD Signature Date/Time: 02/28/2020/12:08:30 PM    Final  (Updated)        Meredeth Ide   [facility] If 7PM-7AM, please contact night-coverage at [email or web address], Office  [phone]   02/29/2020, 11:06 AM  LOS: 0 days

## 2020-03-01 ENCOUNTER — Ambulatory Visit (INDEPENDENT_AMBULATORY_CARE_PROVIDER_SITE_OTHER): Payer: Medicare Other

## 2020-03-01 ENCOUNTER — Inpatient Hospital Stay (HOSPITAL_COMMUNITY): Payer: Medicare Other

## 2020-03-01 ENCOUNTER — Other Ambulatory Visit: Payer: Self-pay | Admitting: Physician Assistant

## 2020-03-01 DIAGNOSIS — R55 Syncope and collapse: Secondary | ICD-10-CM | POA: Diagnosis not present

## 2020-03-01 DIAGNOSIS — R7989 Other specified abnormal findings of blood chemistry: Secondary | ICD-10-CM

## 2020-03-01 LAB — BASIC METABOLIC PANEL
Anion gap: 10 (ref 5–15)
BUN: 12 mg/dL (ref 8–23)
CO2: 24 mmol/L (ref 22–32)
Calcium: 9.2 mg/dL (ref 8.9–10.3)
Chloride: 104 mmol/L (ref 98–111)
Creatinine, Ser: 0.92 mg/dL (ref 0.44–1.00)
GFR calc Af Amer: >60 mL/min (ref >60)
GFR calc non Af Amer: 54 mL/min — ABNORMAL LOW (ref >60)
Glucose, Bld: 135 mg/dL — ABNORMAL HIGH (ref 70–99)
Potassium: 3.8 mmol/L (ref 3.5–5.1)
Sodium: 138 mmol/L (ref 135–145)

## 2020-03-01 LAB — CBC
HCT: 38.7 % (ref 36.0–46.0)
Hemoglobin: 11.7 g/dL — ABNORMAL LOW (ref 12.0–15.0)
MCH: 21.5 pg — ABNORMAL LOW (ref 26.0–34.0)
MCHC: 30.2 g/dL (ref 30.0–36.0)
MCV: 71.1 fL — ABNORMAL LOW (ref 80.0–100.0)
Platelets: 225 10*3/uL (ref 150–400)
RBC: 5.44 MIL/uL — ABNORMAL HIGH (ref 3.87–5.11)
RDW: 16.5 % — ABNORMAL HIGH (ref 11.5–15.5)
WBC: 4.4 10*3/uL (ref 4.0–10.5)
nRBC: 0 % (ref 0.0–0.2)

## 2020-03-01 MED ORDER — HYDRALAZINE HCL 50 MG PO TABS
50.0000 mg | ORAL_TABLET | Freq: Two times a day (BID) | ORAL | Status: DC
  Administered 2020-03-01: 50 mg via ORAL
  Filled 2020-03-01: qty 1

## 2020-03-01 MED ORDER — HYDRALAZINE HCL 50 MG PO TABS: 50.0000 mg | ORAL_TABLET | Freq: Two times a day (BID) | ORAL | 2 refills | Status: AC

## 2020-03-01 MED ORDER — IOHEXOL 350 MG/ML SOLN
75.0000 mL | Freq: Once | INTRAVENOUS | Status: AC | PRN
  Administered 2020-03-01: 75 mL via INTRAVENOUS

## 2020-03-01 MED ORDER — ENOXAPARIN SODIUM 40 MG/0.4ML ~~LOC~~ SOLN: 40.0000 mg | Freq: Every day | SUBCUTANEOUS | Status: DC

## 2020-03-01 NOTE — Progress Notes (Signed)
Patient was discharged home by MD order; discharged instructions  review and give to patient with care notes; IV DIC; skin intact; patient will be escorted to the car by nurse tech via wheelchair.  

## 2020-03-01 NOTE — Progress Notes (Signed)
Zio patch placed onto patient.  All instructions and information reviewed with patient, they verbalize understanding with no questions. 

## 2020-03-01 NOTE — Discharge Summary (Addendum)
Physician Discharge Summary  [name] ZOX:[id number] DOB: 01-05-1925 DOA: 02/27/2020  PCP: Creola Cornusso, John, MD  Admit date: 02/27/2020 Discharge date: 03/01/2020  Time spent: 50 minutes  Recommendations for Outpatient Follow-up:  1. Follow-up PCP in 2 weeks 2. Follow-up cardiology in 4 weeks 3. Holter monitor will be set up before patient is discharged 4. Stop taking Tenormin 5. Dose of hydralazine changed to 50 mg twice a day 6. PT evaluation was done, physical therapy recommended skilled nursing facility, but patient has chosen to go home with home health PT.  Will arrange for home health PT, social work, home health aide.  Discharge Diagnoses:  Principal Problem:   Syncope Active Problems:   Hypertension   Anemia   Discharge Condition: Stable  Diet recommendation: Heart healthy diet  Filed Weights   02/28/20 2150 02/29/20 0030 03/01/20 0001  Weight: 61.7 kg 60.8 kg 59.7 kg    History of present illness:  86 year old female with history of hypertension, stage IIIa CKD, anemia presented with syncope.  CT head and MRI brain were unremarkable.  EKG showed normal sinus rhythm.  Her home medications for hypertension were held for questionable orthostasis.   Hospital Course:  1. Syncope-   ? Cause,  orthostatic vital signs are normal.  2D echo shows EF 60 to 65%, grade 1 diastolic dysfunction.  She does have bradycardia seen on telemetry, she was taking atenolol at home which is currently on hold.  Heart rate has improved since holding atenolol.  Cardiac enzymes x2 were negative. D-dimer is elevated at 9.65.    CTA chest was negative for PE.  Holter monitor will be set up today before discharge.  Patient can follow-up with cardiology in 4 weeks.   2. Hypertension-blood pressure is now elevated, restart home medications including amlodipine, losartan, hydralazine.  Continue to hold atenolol.    Dose of hydralazine has been changed to 50 mg p.o. twice daily. 3. CKD stage  IIIa-creatinine 1.05, at baseline. 4. Elevated D-dimer-D-dimer elevated at 9.65, CTA chest is negative for pulmonary embolism.  Lower extremity venous duplex also checked which is negative for DVT.  Procedures:  Lower extremity venous duplex  Consultations:    Discharge Exam: Vitals:   03/01/20 0748 03/01/20 1157  BP: (!) 160/89 (!) 113/53  Pulse: 60 68  Resp: 19 18  Temp: 97.8 F (36.6 C) 98.6 F (37 C)  SpO2: 99% 98%    General: Appears in no acute distress Cardiovascular: S1-S2, regular, no murmur auscultated Respiratory: Clear to auscultation bilaterally  Discharge Instructions   Discharge Instructions    Diet - low sodium heart healthy   Complete by: As directed    Increase activity slowly   Complete by: As directed      Allergies as of 03/01/2020      Reactions   Iodine Hives      Medication List    STOP taking these medications   atenolol 50 MG tablet Commonly known as: TENORMIN     TAKE these medications   amLODipine 10 MG tablet Commonly known as: NORVASC Take 10 mg by mouth daily.   Artificial Tears 1.4 % ophthalmic solution Generic drug: polyvinyl alcohol Place 1 drop into both eyes as needed for dry eyes.   atorvastatin 80 MG tablet Commonly known as: LIPITOR Take 80 mg by mouth daily.   calcium-vitamin D 500-200 MG-UNIT tablet Commonly known as: OSCAL WITH D Take 1 tablet by mouth daily.   cholecalciferol 1000 units tablet Commonly known as: VITAMIN D  Take 2,000 Units by mouth daily.   Fish Oil 1000 MG Caps Take 1,000 mg by mouth daily.   hydrALAZINE 50 MG tablet Commonly known as: APRESOLINE Take 1 tablet (50 mg total) by mouth 2 (two) times daily. What changed:   medication strength  how much to take   losartan 25 MG tablet Commonly known as: COZAAR Take 50 mg by mouth daily.   multivitamin with minerals Tabs tablet Take 1 tablet by mouth daily.   pantoprazole 20 MG tablet Commonly known as: PROTONIX Take 1  tablet (20 mg total) by mouth daily.      Allergies  Allergen Reactions  . Iodine Hives      The results of significant diagnostics from this hospitalization (including imaging, microbiology, ancillary and laboratory) are listed below for reference.    Significant Diagnostic Studies: CT HEAD WO CONTRAST  Result Date: 02/27/2020 CLINICAL DATA:  Head trauma EXAM: CT HEAD WITHOUT CONTRAST TECHNIQUE: Contiguous axial images were obtained from the base of the skull through the vertex without intravenous contrast. COMPARISON:  None. FINDINGS: Brain: Normal anatomic configuration. Mild parenchymal volume loss is commensurate with the patient's age. Moderate periventricular white matter changes are present likely reflecting the sequela of small vessel ischemia. Tiny lacunar infarct noted within the right caudate nucleus. No abnormal intra or extra-axial mass lesion or fluid collection. No abnormal mass effect or midline shift. No evidence of acute intracranial hemorrhage or infarct. Ventricular size is normal. Cerebellum unremarkable. Vascular: Unremarkable Skull: Intact Sinuses/Orbits: Paranasal sinuses are clear. Orbits are unremarkable. Other: Mastoid air cells and middle ear cavities are clear. IMPRESSION: 1. No acute intracranial abnormality. Electronically Signed   By: Helyn Numbers MD   On: 02/27/2020 21:06   CT ANGIO CHEST PE W OR WO CONTRAST  Result Date: 03/01/2020 CLINICAL DATA:  Syncope and elevated D-dimer EXAM: CT ANGIOGRAPHY CHEST WITH CONTRAST TECHNIQUE: Multidetector CT imaging of the chest was performed using the standard protocol during bolus administration of intravenous contrast. Multiplanar CT image reconstructions and MIPs were obtained to evaluate the vascular anatomy. CONTRAST:  31mL OMNIPAQUE IOHEXOL 350 MG/ML SOLN COMPARISON:  Chest CTA 07/06/2018 FINDINGS: Cardiovascular: There may be left ventricular hypertrophy. Heart size is overall normal with no pericardial effusion.  Aortic and coronary atherosclerosis. No pulmonary artery filling defect. Mediastinum/Nodes: Negative for adenopathy or mass Lungs/Pleura: Borderline cylindrical bronchiectasis in the lower lobes where there is atelectasis. Trace pleural fluid at the bases. Upper Abdomen: Colonic diverticulosis. Musculoskeletal: Spondylosis and kyphosis with multi-level bridging osteophyte. Review of the MIP images confirms the above findings. IMPRESSION: 1. Negative for pulmonary embolism. 2. Dependent atelectasis. Aortic Atherosclerosis (ICD10-I70.0). Electronically Signed   By: Marnee Spring M.D.   On: 03/01/2020 05:42   MR BRAIN WO CONTRAST  Result Date: 02/28/2020 CLINICAL DATA:  Mental status change with unknown cause EXAM: MRI HEAD WITHOUT CONTRAST TECHNIQUE: Multiplanar, multiecho pulse sequences of the brain and surrounding structures were obtained without intravenous contrast. COMPARISON:  Head CT from yesterday FINDINGS: Brain: No acute infarction, hemorrhage, hydrocephalus, extra-axial collection or mass lesion. Volume loss in keeping with aging. Mild for age chronic small vessel ischemic change in the deep white matter. Vascular: Normal flow voids Skull and upper cervical spine: Normal marrow signal Sinuses/Orbits: Bilateral cataract resection.  No acute finding IMPRESSION: Age congruent brain MRI.  No acute or reversible findings. Electronically Signed   By: Marnee Spring M.D.   On: 02/28/2020 04:23   DG Chest Port 1 View  Result Date: 02/27/2020 CLINICAL DATA:  86 year old female with syncope today. EXAM: PORTABLE CHEST 1 VIEW COMPARISON:  Chest radiograph 12/27/2019 and earlier. FINDINGS: Portable AP semi upright view at 1907 hours.  Stable lung volumes. Stable borderline to mild cardiomegaly. Other mediastinal contours are within normal limits. Visualized tracheal air column is within normal limits. Allowing for portable technique the lungs are clear. Stable visualized osseous structures. Negative visible  bowel gas pattern. IMPRESSION: Stable mild cardiomegaly.  No acute cardiopulmonary abnormality. Electronically Signed   By: Odessa Fleming M.D.   On: 02/27/2020 19:26   ECHOCARDIOGRAM COMPLETE  Result Date: 02/29/2020    ECHOCARDIOGRAM REPORT   Patient Name:   [name] Date of Exam: 02/28/2020 Medical Rec #:  [id number]   Height:       63.0 in Accession #:    [id number]  Weight:       130.1 lb Date of Birth:  03-Sep-1924   BSA:          1.611 m Patient Age:    86 years    BP:           153/75 mmHg Patient Gender: F           HR:           57 bpm. Exam Location:  Inpatient Procedure: 2D Echo, Cardiac Doppler and Color Doppler Indications:     Syncope  History:         Patient has prior history of Echocardiogram examinations, most                  recent 09/30/2012. Risk Factors:Hypertension and Dyslipidemia.                  CKD.  Sonographer:     Ross Ludwig RDCS (AE) Referring Phys:  [id number] [facility] Diagnosing Phys: Armanda Magic MD IMPRESSIONS  1. Left ventricular ejection fraction, by estimation, is 60 to 65%. The left ventricle has normal function. The left ventricle has no regional wall motion abnormalities. There is mild concentric left ventricular hypertrophy. Left ventricular diastolic parameters are consistent with Grade I diastolic dysfunction (impaired relaxation).  2. Right ventricular systolic function is normal. The right ventricular size is normal.  3. The mitral valve is normal in structure. Trivial mitral valve regurgitation. No evidence of mitral stenosis.  4. The aortic valve is tricuspid. Aortic valve regurgitation is not visualized. Mild to moderate aortic valve sclerosis/calcification is present, without any evidence of aortic stenosis.  5. The inferior vena cava is normal in size with greater than 50% respiratory variability, suggesting right atrial pressure of 3 mmHg. FINDINGS  Left Ventricle: Left ventricular ejection fraction, by estimation, is 60 to 65%. The left ventricle has normal  function. The left ventricle has no regional wall motion abnormalities. The left ventricular internal cavity size was normal in size. There is  mild concentric left ventricular hypertrophy. Left ventricular diastolic parameters are consistent with Grade I diastolic dysfunction (impaired relaxation). Right Ventricle: The right ventricular size is normal. No increase in right ventricular wall thickness. Right ventricular systolic function is normal. Left Atrium: Left atrial size was normal in size. Right Atrium: Right atrial size was normal in size. Pericardium: There is no evidence of pericardial effusion. Mitral Valve: The mitral valve is normal in structure. There is mild thickening of the mitral valve leaflet(s). Mild mitral annular calcification. Trivial mitral valve regurgitation. No evidence of mitral valve stenosis. MV peak gradient, 3.8 mmHg. The mean mitral valve gradient is 1.0 mmHg. Tricuspid Valve:  The tricuspid valve is normal in structure. Tricuspid valve regurgitation is not demonstrated. No evidence of tricuspid stenosis. Aortic Valve: The aortic valve is tricuspid. Aortic valve regurgitation is not visualized. Mild to moderate aortic valve sclerosis/calcification is present, without any evidence of aortic stenosis. Aortic valve mean gradient measures 5.0 mmHg. Aortic valve peak gradient measures 9.7 mmHg. Aortic valve area, by VTI measures 2.07 cm. Pulmonic Valve: The pulmonic valve was normal in structure. Pulmonic valve regurgitation is not visualized. No evidence of pulmonic stenosis. Aorta: The aortic root is normal in size and structure. Venous: The inferior vena cava is normal in size with greater than 50% respiratory variability, suggesting right atrial pressure of 3 mmHg. IAS/Shunts: No atrial level shunt detected by color flow Doppler.  LEFT VENTRICLE PLAX 2D LVIDd:         3.80 cm  Diastology LVIDs:         2.70 cm  LV e' medial:    5.44 cm/s LV PW:         1.20 cm  LV E/e' medial:  10.6  LV IVS:        1.10 cm  LV e' lateral:   5.87 cm/s LVOT diam:     2.00 cm  LV E/e' lateral: 9.8 LV SV:         72 LV SV Index:   45 LVOT Area:     3.14 cm  RIGHT VENTRICLE            IVC RV Basal diam:  2.90 cm    IVC diam: 0.90 cm RV S prime:     8.55 cm/s TAPSE (M-mode): 2.5 cm LEFT ATRIUM             Index       RIGHT ATRIUM           Index LA diam:        4.10 cm 2.55 cm/m  RA Area:     13.30 cm LA Vol (A2C):   44.8 ml 27.82 ml/m RA Volume:   32.40 ml  20.12 ml/m LA Vol (A4C):   56.7 ml 35.21 ml/m LA Biplane Vol: 51.2 ml 31.79 ml/m  AORTIC VALVE AV Area (Vmax):    1.93 cm AV Area (Vmean):   2.00 cm AV Area (VTI):     2.07 cm AV Vmax:           156.00 cm/s AV Vmean:          97.900 cm/s AV VTI:            0.347 m AV Peak Grad:      9.7 mmHg AV Mean Grad:      5.0 mmHg LVOT Vmax:         96.00 cm/s LVOT Vmean:        62.200 cm/s LVOT VTI:          0.229 m LVOT/AV VTI ratio: 0.66  AORTA Ao Root diam: 3.20 cm MITRAL VALVE MV Area (PHT): 2.16 cm    SHUNTS MV Peak grad:  3.8 mmHg    Systemic VTI:  0.23 m MV Mean grad:  1.0 mmHg    Systemic Diam: 2.00 cm MV Vmax:       0.98 m/s MV Vmean:      54.3 cm/s MV Decel Time: 352 msec MV E velocity: 57.40 cm/s MV A velocity: 89.50 cm/s MV E/A ratio:  0.64 Armanda Magic MD Electronically signed by Armanda Magic MD Signature Date/Time: 02/28/2020/12:08:30 PM  Final (Updated)    VAS [place] LOWER EXTREMITY VENOUS (DVT)  Result Date: 03/01/2020  Lower Venous DVT Study Indications: Elevated D-Dimer.  Comparison Study: No prior study on file Performing Technologist: Sherren Kerns RVS  Examination Guidelines: A complete evaluation includes B-mode imaging, spectral Doppler, color Doppler, and power Doppler as needed of all accessible portions of each vessel. Bilateral testing is considered an integral part of a complete examination. Limited examinations for reoccurring indications may be performed as noted. The reflux portion of the exam is performed with the patient in  reverse Trendelenburg.  +---------+---------------+---------+-----------+----------+--------------+ RIGHT    CompressibilityPhasicitySpontaneityPropertiesThrombus Aging +---------+---------------+---------+-----------+----------+--------------+ CFV      Full           Yes      Yes                                 +---------+---------------+---------+-----------+----------+--------------+ SFJ      Full                                                        +---------+---------------+---------+-----------+----------+--------------+ FV Prox  Full                                                        +---------+---------------+---------+-----------+----------+--------------+ FV Mid   Full                                                        +---------+---------------+---------+-----------+----------+--------------+ FV DistalFull                                                        +---------+---------------+---------+-----------+----------+--------------+ PFV      Full                                                        +---------+---------------+---------+-----------+----------+--------------+ POP      Full           Yes                                          +---------+---------------+---------+-----------+----------+--------------+ PTV      Full                                                        +---------+---------------+---------+-----------+----------+--------------+ PERO     Full                                                        +---------+---------------+---------+-----------+----------+--------------+   +---------+---------------+---------+-----------+----------+--------------+  LEFT     CompressibilityPhasicitySpontaneityPropertiesThrombus Aging +---------+---------------+---------+-----------+----------+--------------+ CFV      Full           Yes      Yes                                  +---------+---------------+---------+-----------+----------+--------------+ SFJ      Full                                                        +---------+---------------+---------+-----------+----------+--------------+ FV Prox  Full                                                        +---------+---------------+---------+-----------+----------+--------------+ FV Mid   Full                                                        +---------+---------------+---------+-----------+----------+--------------+ FV DistalFull                                                        +---------+---------------+---------+-----------+----------+--------------+ PFV      Full                                                        +---------+---------------+---------+-----------+----------+--------------+ POP      Full           Yes      Yes                                 +---------+---------------+---------+-----------+----------+--------------+ PTV      Full                                                        +---------+---------------+---------+-----------+----------+--------------+ PERO     Full                                                        +---------+---------------+---------+-----------+----------+--------------+     Summary: BILATERAL: - No evidence of deep vein thrombosis seen in the lower extremities, bilaterally. -   *See table(s) above for measurements and observations.    Preliminary     Microbiology: Recent Results (from the past 240 hour(s))  SARS  Coronavirus 2 by RT PCR (hospital order, performed in [facility] hospital lab) Nasopharyngeal Nasopharyngeal Swab     Status: None   Collection Time: 02/27/20 11:16 PM   Specimen: Nasopharyngeal Swab  Result Value Ref Range Status   SARS Coronavirus 2 NEGATIVE NEGATIVE Final    Comment: (NOTE) SARS-CoV-2 target nucleic acids are NOT DETECTED.  The SARS-CoV-2 RNA is generally detectable in  upper and lower respiratory specimens during the acute phase of infection. The lowest concentration of SARS-CoV-2 viral copies this assay can detect is 250 copies / mL. A negative result does not preclude SARS-CoV-2 infection and should not be used as the sole basis for treatment or other patient management decisions.  A negative result may occur with improper specimen collection / handling, submission of specimen other than nasopharyngeal swab, presence of viral mutation(s) within the areas targeted by this assay, and inadequate number of viral copies (<250 copies / mL). A negative result must be combined with clinical observations, patient history, and epidemiological information.  Fact Sheet for Patients:   BoilerBrush.com.cy  Fact Sheet for Healthcare Providers: https://pope.com/  This test is not yet approved or  cleared by the [place] FDA and has been authorized for detection and/or diagnosis of SARS-CoV-2 by FDA under an Emergency Use Authorization (EUA).  This EUA will remain in effect (meaning this test can be used) for the duration of the COVID-19 declaration under Section 564(b)(1) of the Act, 21 U.S.C. section 360bbb-3(b)(1), unless the authorization is terminated or revoked sooner.  Performed at [facility], [address], [place], [place] [address]      Labs: Basic Metabolic Panel: Recent Labs  Lab 02/27/20 1949 02/28/20 0643 03/01/20 0835  NA 139 137 138  K 3.7 3.9 3.8  CL 106 104 104  CO2 21* 21* 24  GLUCOSE 124* 98 135*  BUN CREATININE 1.05* 1.03* 0.92  CALCIUM 9.7 8.9 9.2   Liver Function Tests: Recent Labs  Lab 02/27/20 1949  AST 25  ALT 17  ALKPHOS 73  BILITOT 0.6  PROT 7.1  ALBUMIN 3.3*   Recent Labs  Lab 02/27/20 1949  LIPASE 37   No results for input(s): AMMONIA in the last 168 hours. CBC: Recent Labs  Lab 02/27/20 1949 02/28/20 0643 03/01/20 0835  WBC 8.0  7.9 4.4  NEUTROABS 5.9  --   --   HGB 11.2* 11.0* 11.7*  HCT 37.4 36.7 38.7  MCV 71.6* 73.1* 71.1*  PLT 223 283 225       Signed:  Meredeth Ide MD.  [facility] 03/01/2020, 2:18 PM

## 2020-03-01 NOTE — Progress Notes (Addendum)
   Zio monitor requested for syncope.   Monitor is ordered, to be put on before d/c.  Dr Royann Shivers is the Cardiologist of record.  Pt is to get CTA chest to r/o d-dimer.  Once those results are reviewed, decide if monitor still needed.  If she is for d/c today, monitor can be placed today.  Please contact me by page or secure chat, or call [phone] to get monitor put on the day of d/c.   Theodore Demark, PA-C 03/01/2020 9:19 AM [phone]

## 2020-03-01 NOTE — TOC Initial Note (Addendum)
Transition of Care [facility]) - Initial/Assessment Note    Patient Details  Name: [name] MRN: [id number] Date of Birth: [birth year]  [facility] CM/SW Contact:    Reola Mosher Transition of Care Supervisor Phone Number: 03/01/2020, 3:08 PM  Clinical Narrative:                 PCP: [name], MD; talked to patient with son at the bedside; he stated that he wants the patient home at discharge; HHC choice offered, they chose [facility]; Tiffany with [facility] called for arrangement- unable to accept the patient due to staffing. [facility] Called, spoke to Lupita Leash, unable to take the pt due to staffing. [facility] called- awaiting callback.  1550 pm - Received call from [facility] with [facility], they can accept the patient for [facility] services. Patient and son at the bedside updated and is in agreement with the discharge plan. Abelino Derrick RN,MHA,BSN  Expected Discharge Plan: Home w Home Health Services Barriers to Discharge: No Barriers Identified   Patient Goals and CMS Choice Patient states their goals for this hospitalization and ongoing recovery are:: to get stronger CMS Medicare.gov Compare Post Acute Care list provided to:: Patient Represenative (must comment) Choice offered to / list presented to : Adult Children  Expected Discharge Plan and Services Expected Discharge Plan: Home w Home Health Services In-house Referral: NA Discharge Planning Services: CM Consult   Living arrangements for the past 2 months: Single Family Home Expected Discharge Date: 03/01/20                         HH Arranged: PT, Nurse's Aide, Social Work Date [employer] Contacted: 03/01/20  Prior Living Arrangements/Services Living arrangements for the past 2 months: Single Family Home Lives with:: Adult Children Patient language and need for interpreter reviewed:: No Do you feel safe going back to the place where you live?: Yes      Need for Family Participation in  Patient Care: No (Comment) Care giver support system in place?: Yes (comment)   Criminal Activity/Legal Involvement Pertinent to Current Situation/Hospitalization: No - Comment as needed  Activities of Daily Living Home Assistive Devices/Equipment: None ADL Screening (condition at time of admission) Patient's cognitive ability adequate to safely complete daily activities?: Yes Is the patient deaf or have difficulty hearing?: No Does the patient have difficulty seeing, even when wearing glasses/contacts?: No Does the patient have difficulty concentrating, remembering, or making decisions?: Yes Patient able to express need for assistance with ADLs?: Yes Does the patient have difficulty dressing or bathing?: No Independently performs ADLs?: Yes (appropriate for developmental age) Does the patient have difficulty walking or climbing stairs?: Yes Weakness of Legs: Both Weakness of Arms/Hands: Both  Permission Sought/Granted Permission sought to share information with : Case Manager Permission granted to share information with : Yes, Verbal Permission Granted  Share Information with NAME: son  Permission granted to share info w AGENCY: HHC agency        Emotional Assessment Appearance:: Appears older than stated age Attitude/Demeanor/Rapport: Gracious Affect (typically observed): Flat Orientation: : Fluctuating Orientation (Suspected and/or reported Sundowners) Alcohol / Substance Use: Not Applicable Psych Involvement: No (comment)  Admission diagnosis:  Syncope [R55] Syncope, unspecified syncope type [R55] Patient Active Problem List   Diagnosis Date Noted  . Syncope 09/29/2012  . Hypertension 09/29/2012  . Hyperlipidemia 09/29/2012  . Anemia 09/29/2012  . Chronic abdominal pain 09/29/2012   PCP:  Timothy Lasso,  Jonny Ruiz, MD Pharmacy:   [facility] [phone] Gin[place] Otto, [place] - [address] [place] [address] Phone: [phone] Fax:  [phone]     Social Determinants of Health (SDOH) Interventions    Readmission Risk Interventions No flowsheet data found.

## 2020-03-01 NOTE — Progress Notes (Signed)
VASCULAR LAB    Bilateral lower extremity venous duplex has been performed.  See CV proc for preliminary results.   Tihanna Goodson, RVT 03/01/2020, 12:15 PM

## 2020-04-11 ENCOUNTER — Ambulatory Visit: Payer: Medicare Other | Admitting: Physician Assistant

## 2020-04-11 ENCOUNTER — Other Ambulatory Visit: Payer: Self-pay

## 2020-04-11 ENCOUNTER — Encounter: Payer: Self-pay | Admitting: Physician Assistant

## 2020-04-11 VITALS — BP 143/67 | HR 85 | Ht 61.0 in | Wt 135.8 lb

## 2020-04-11 DIAGNOSIS — E785 Hyperlipidemia, unspecified: Secondary | ICD-10-CM | POA: Diagnosis not present

## 2020-04-11 DIAGNOSIS — R55 Syncope and collapse: Secondary | ICD-10-CM | POA: Diagnosis not present

## 2020-04-11 DIAGNOSIS — I10 Essential (primary) hypertension: Secondary | ICD-10-CM | POA: Diagnosis not present

## 2020-04-11 NOTE — Patient Instructions (Signed)
Medication Instructions:  Your physician recommends that you continue on your current medications as directed. Please refer to the Current Medication list given to you today.  *If you need a refill on your cardiac medications before your next appointment, please call your pharmacy*  Lab Work: NONE ordered at this time of appointment   If you have labs (blood work) drawn today and your tests are completely normal, you will receive your results only by: . MyChart Message (if you have MyChart) OR . A paper copy in the mail If you have any lab test that is abnormal or we need to change your treatment, we will call you to review the results.  Testing/Procedures: NONE ordered at this time of appointment   Follow-Up: At [facility], you and your health needs are our priority.  As part of our continuing mission to provide you with exceptional heart care, we have created designated Provider Care Teams.  These Care Teams include your primary Cardiologist (physician) and Advanced Practice Providers (APPs -  Physician Assistants and Nurse Practitioners) who all work together to provide you with the care you need, when you need it.  We recommend signing up for the patient portal called "MyChart".  Sign up information is provided on this After Visit Summary.  MyChart is used to connect with patients for Virtual Visits (Telemedicine).  Patients are able to view lab/test results, encounter notes, upcoming appointments, etc.  Non-urgent messages can be sent to your provider as well.   To learn more about what you can do with MyChart, go to [email or web address].    Your next appointment:   6 month(s)  The format for your next appointment:   In Person  Provider:   Mihai Croitoru, MD  Other Instructions   

## 2020-04-11 NOTE — Progress Notes (Signed)
Cardiology Office Note:    Date:  04/13/2020   ID:  [name], DOB 1925/04/06, MRN [id number]  PCP:  Madison Corn, MD  [facility] Cardiologist:  Madison Fair, MD  [facility] Electrophysiologist:  None   Referring MD: Madison Corn, MD   Chief Complaint  Patient presents with  . Follow-up    seen for Dr. Royann Proctor    History of Present Illness:    [name] is a 86 y.o. female with a hx of hypertension, anemia, CKD stage III, hyperlipidemia, and recurrent syncope.  Patient had a episode of syncope in 2014 that was attributed to orthostatic dizziness.  EF was 55 to 60% based on echocardiogram at the time.  More recently, patient presented to the hospital again on 02/28/2020 with loss of consciousness.  She denied any chest discomfort prior to the event.  Orthostatic vital sign was negative upon arrival at the ED however it was noted she was very unsteady upon standing.  CT and MRI of the head was negative.  EKG showed normal sinus rhythm without significant ST-T wave changes.  Troponin was negative.  Patient was hydrated.  Echocardiogram obtained on 02/28/2020 showed EF 60 to 65%, mild LVH, trivial MR.  D-dimer was elevated however subsequent coronary CT obtained on 03/01/2020 was negative for PE.  Lower extremity venous Doppler was also negative for DVT.  During the hospitalization, patient did have episode of bradycardia seen on telemetry.  Her atenolol was discontinued.  Hydralazine was increased to 50 mg twice a day.  A 2-week ZIO monitor was placed on the patient prior to discharge.  Her monitor showed sinus rhythm with frequent but very brief episode of nonsustained ectopic atrial tachycardia, single 4 beats run of nonsustained VT, no evidence of atrial fibrillation.  [name].  Since discharge, she is drinking and eating little bit more.  She has not had any significant dizziness or feeling of passing out.  We reviewed his heart monitor  result on the recent echocardiogram.  Given reassuring studies, I would recommend a 4-month follow-up and to continue to observe her symptom.  I plan to set her up with Dr. Royann Proctor again in 6 months.  She is also currently doing physical therapy as well, I think this will be beneficial for her and recommended extend her physical therapy for 1 more months with 2-3 times per week training episodes.  Past Medical History:  Diagnosis Date  . Diverticular disease   . GERD (gastroesophageal reflux disease)   . Hiatal hernia   . High cholesterol   . Hypertension     Past Surgical History:  Procedure Laterality Date  . ABDOMINAL HYSTERECTOMY    . HYSTEROTOMY    . LIVER BIOPSY      Current Medications: Current Meds  Medication Sig  . amLODipine (NORVASC) 10 MG tablet Take 10 mg by mouth daily.  Marland Kitchen atorvastatin (LIPITOR) 80 MG tablet Take 80 mg by mouth daily.  . calcium-vitamin D (OSCAL WITH D) 500-200 MG-UNIT per tablet Take 1 tablet by mouth daily.  . cholecalciferol (VITAMIN D) 1000 UNITS tablet Take 2,000 Units by mouth daily.  . hydrALAZINE (APRESOLINE) 50 MG tablet Take 1 tablet (50 mg total) by mouth 2 (two) times daily.  Marland Kitchen losartan (COZAAR) 25 MG tablet Take 50 mg by mouth daily.  . Multiple Vitamin (MULITIVITAMIN WITH MINERALS) TABS Take 1 tablet by mouth daily.  . Omega-3 Fatty Acids (FISH OIL) 1000 MG CAPS Take 1,000 mg  by mouth daily.   . pantoprazole (PROTONIX) 20 MG tablet Take 1 tablet (20 mg total) by mouth daily.  . polyvinyl alcohol (ARTIFICIAL TEARS) 1.4 % ophthalmic solution Place 1 drop into both eyes as needed for dry eyes.     Allergies:   Iodine   Social History   Socioeconomic History  . Marital status: Widowed    Spouse name: Not on file  . Number of children: Not on file  . Years of education: Not on file  . Highest education level: Not on file  Occupational History  . Not on file  Tobacco Use  . Smoking status: Never Smoker  . Smokeless tobacco: Never  Used  Vaping Use  . Vaping Use: Never used  Substance and Sexual Activity  . Alcohol use: No  . Drug use: No  . Sexual activity: Not Currently  Other Topics Concern  . Not on file  Social History Narrative  . Not on file   Social Determinants of Health   Financial Resource Strain:   . Difficulty of Paying Living Expenses: Not on file  Food Insecurity:   . Worried About Programme researcher, broadcasting/film/video in the Last Year: Not on file  . Ran Out of Food in the Last Year: Not on file  Transportation Needs:   . Lack of Transportation (Medical): Not on file  . Lack of Transportation (Non-Medical): Not on file  Physical Activity:   . Days of Exercise per Week: Not on file  . Minutes of Exercise per Session: Not on file  Stress:   . Feeling of Stress : Not on file  Social Connections:   . Frequency of Communication with Friends and Family: Not on file  . Frequency of Social Gatherings with Friends and Family: Not on file  . Attends Religious Services: Not on file  . Active Member of Clubs or Organizations: Not on file  . Attends Banker Meetings: Not on file  . Marital Status: Not on file     Family History: The patient's Family history is unknown by patient.  ROS:   Please see the history of present illness.     All other systems reviewed and are negative.  EKGs/Labs/Other Studies Reviewed:    The following studies were reviewed today:  Echo 02/28/2020 1. Left ventricular ejection fraction, by estimation, is 60 to 65%. The  left ventricle has normal function. The left ventricle has no regional  wall motion abnormalities. There is mild concentric left ventricular  hypertrophy. Left ventricular diastolic  parameters are consistent with Grade I diastolic dysfunction (impaired  relaxation).  2. Right ventricular systolic function is normal. The right ventricular  size is normal.  3. The mitral valve is normal in structure. Trivial mitral valve  regurgitation. No evidence  of mitral stenosis.  4. The aortic valve is tricuspid. Aortic valve regurgitation is not  visualized. Mild to moderate aortic valve sclerosis/calcification is  present, without any evidence of aortic stenosis.  5. The inferior vena cava is normal in size with greater than 50%  respiratory variability, suggesting right atrial pressure of 3 mmHg.   EKG:  EKG is not ordered today.    Recent Labs: 02/27/2020: ALT 17 03/01/2020: BUN 12; Creatinine, Ser 0.92; Hemoglobin 11.7; Platelets 225; Potassium 3.8; Sodium 138  Recent Lipid Panel No results found for: CHOL, TRIG, HDL, CHOLHDL, VLDL, LDLCALC, LDLDIRECT   Risk Assessment/Calculations:       Physical Exam:    VS:  BP (!) 143/67  Pulse 85   Ht 5\' 1"  (1.549 m)   Wt 135 lb 12.8 oz (61.6 kg)   SpO2 98%   BMI 25.66 kg/m     Wt Readings from Last 3 Encounters:  04/11/20 135 lb 12.8 oz (61.6 kg)  03/01/20 131 lb 9.6 oz (59.7 kg)  12/27/19 130 lb 1.1 oz (59 kg)     GEN:  Well nourished, well developed in no acute distress HEENT: Normal NECK: No JVD; No carotid bruits LYMPHATICS: No lymphadenopathy CARDIAC: RRR, no murmurs, rubs, gallops RESPIRATORY:  Clear to auscultation without rales, wheezing or rhonchi  ABDOMEN: Soft, non-tender, non-distended MUSCULOSKELETAL:  No edema; No deformity  SKIN: Warm and dry NEUROLOGIC:  Alert and oriented x 3 PSYCHIATRIC:  Normal affect   ASSESSMENT:    1. Syncope and collapse   2. Primary hypertension   3. Hyperlipidemia LDL goal <100    PLAN:    In order of problems listed above:  1. Syncope: Echocardiogram and heart monitor were reassuring.  She had episodic bradycardia during the hospitalization, atenolol was discontinued.  We will continue observation at this point and to follow-up in 72-month  2. Hypertension: Since discontinuation of atenolol, blood pressure has been drifting higher.  Given the recent syncope, will aim for systolic blood pressure in the 130s to 140s  range.  3. Hyperlipidemia: On Lipitor   Medication Adjustments/Labs and Tests Ordered: Current medicines are reviewed at length with the patient today.  Concerns regarding medicines are outlined above.  No orders of the defined types were placed in this encounter.  No orders of the defined types were placed in this encounter.   Patient Instructions  Medication Instructions:  Your physician recommends that you continue on your current medications as directed. Please refer to the Current Medication list given to you today.  *If you need a refill on your cardiac medications before your next appointment, please call your pharmacy*  Lab Work: NONE ordered at this time of appointment   If you have labs (blood work) drawn today and your tests are completely normal, you will receive your results only by: 8-month MyChart Message (if you have MyChart) OR . A paper copy in the mail If you have any lab test that is abnormal or we need to change your treatment, we will call you to review the results.  Testing/Procedures: NONE ordered at this time of appointment   Follow-Up: At [facility], you and your health needs are our priority.  As part of our continuing mission to provide you with exceptional heart care, we have created designated Provider Care Teams.  These Care Teams include your primary Cardiologist (physician) and Advanced Practice Providers (APPs -  Physician Assistants and Nurse Practitioners) who all work together to provide you with the care you need, when you need it.  We recommend signing up for the patient portal called "MyChart".  Sign up information is provided on this After Visit Summary.  MyChart is used to connect with patients for Virtual Visits (Telemedicine).  Patients are able to view lab/test results, encounter notes, upcoming appointments, etc.  Non-urgent messages can be sent to your provider as well.   To learn more about what you can do with MyChart, go to  [facility].    Your next appointment:   6 month(s)  The format for your next appointment:   In Person  Provider:   [email or web address], MD  Other Instructions      Signed, [name], Azalee Course  04/13/2020 11:40  PM    [facility]

## 2020-04-13 ENCOUNTER — Encounter: Payer: Self-pay | Admitting: Physician Assistant

## 2020-04-19 ENCOUNTER — Telehealth: Payer: Self-pay | Admitting: Cardiovascular Disease

## 2020-04-19 NOTE — Telephone Encounter (Signed)
Left voicemail to let patient know Dr. Salena Saner and nurse are out of the office and will be handled next week.

## 2020-04-19 NOTE — Telephone Encounter (Signed)
Pt son called in and was suppose to call back with the name of the home health agency they are using.  It [facility] . Pt was seen on 10/27 and told call back with this info to extend orders     Best number [phone] [facility] / Belenda Cruise

## 2020-04-19 NOTE — Telephone Encounter (Signed)
Please inform home PT to extend the patient's home physical therapy for 1 month month with 2-3 times per week training episodes as mentioned in my recent office note.  Thank you.

## 2020-05-28 ENCOUNTER — Other Ambulatory Visit: Payer: Self-pay

## 2020-05-28 DIAGNOSIS — R55 Syncope and collapse: Secondary | ICD-10-CM

## 2020-05-28 DIAGNOSIS — R2681 Unsteadiness on feet: Secondary | ICD-10-CM

## 2020-05-28 NOTE — Progress Notes (Signed)
Placed referral to home health for Physical Therapy

## 2020-05-31 ENCOUNTER — Telehealth: Payer: Self-pay | Admitting: Physician Assistant

## 2020-05-31 NOTE — Telephone Encounter (Signed)
Spoke with [facility] [phone]) to obtain information needed for patient to receive physical therapy through their facility---will need to fax order to [phone].

## 2020-11-15 ENCOUNTER — Other Ambulatory Visit: Payer: Self-pay

## 2020-11-15 ENCOUNTER — Encounter: Payer: Self-pay | Admitting: Podiatry

## 2020-11-15 ENCOUNTER — Ambulatory Visit: Payer: Medicare Other | Admitting: Podiatry

## 2020-11-15 DIAGNOSIS — M79674 Pain in right toe(s): Secondary | ICD-10-CM

## 2020-11-15 DIAGNOSIS — B351 Tinea unguium: Secondary | ICD-10-CM | POA: Diagnosis not present

## 2020-11-15 DIAGNOSIS — R54 Age-related physical debility: Secondary | ICD-10-CM | POA: Insufficient documentation

## 2020-11-15 DIAGNOSIS — M79609 Pain in unspecified limb: Secondary | ICD-10-CM | POA: Insufficient documentation

## 2020-11-15 DIAGNOSIS — M21619 Bunion of unspecified foot: Secondary | ICD-10-CM

## 2020-11-15 DIAGNOSIS — M79675 Pain in left toe(s): Secondary | ICD-10-CM | POA: Diagnosis not present

## 2020-11-15 DIAGNOSIS — L03039 Cellulitis of unspecified toe: Secondary | ICD-10-CM | POA: Insufficient documentation

## 2020-11-15 NOTE — Progress Notes (Signed)
Subjective:   Patient ID: [name], female   DOB: 87 y.o.   MRN: [id number]   HPI Patient concerned about thickened severe nail disease 1-5 both feet that make shoe gear difficult and also has significant bunion deformity bilateral.  Presents with caregiver does not smoke is not active currently   Review of Systems  All other systems reviewed and are negative.       Objective:  Physical Exam Vitals and nursing note reviewed.  Constitutional:      Appearance: She is well-developed.  Pulmonary:     Effort: Pulmonary effort is normal.  Musculoskeletal:        General: Normal range of motion.  Skin:    General: Skin is warm.  Neurological:     Mental Status: She is alert.     Vascular status intact mild diminishment of neurological with moderate flatfoot deformity range of motion adequate subtalar midtarsal joint with moderate collapse of the medial longitudinal arch.  Patient has significant bunion deformity bilateral with irritation and has severely thickened nailbeds 1-5 both feet that are painful and make wearing shoe gear difficult due to the elongation pattern.  Patient has good digital perfusion well oriented x3 some     Assessment:  Here mycotic nail infection and significant structural bunion deformity accommodated currently     Plan:  H&P reviewed condition debridement accomplished today no iatrogenic bleeding do not recommend correction of bunions wider shoe gear would probably be best and this would not be an easy one to fix due to advanced age.  Patient will be seen back for routine care as needed encouraged to call questions concerns

## 2021-01-21 ENCOUNTER — Encounter: Payer: Self-pay | Admitting: Podiatry

## 2021-01-21 ENCOUNTER — Ambulatory Visit (INDEPENDENT_AMBULATORY_CARE_PROVIDER_SITE_OTHER): Payer: Medicare Other | Admitting: Podiatry

## 2021-01-21 ENCOUNTER — Other Ambulatory Visit: Payer: Self-pay

## 2021-01-21 DIAGNOSIS — L84 Corns and callosities: Secondary | ICD-10-CM

## 2021-01-21 DIAGNOSIS — M79674 Pain in right toe(s): Secondary | ICD-10-CM | POA: Diagnosis not present

## 2021-01-21 DIAGNOSIS — M79675 Pain in left toe(s): Secondary | ICD-10-CM | POA: Diagnosis not present

## 2021-01-21 DIAGNOSIS — B351 Tinea unguium: Secondary | ICD-10-CM | POA: Diagnosis not present

## 2021-01-24 ENCOUNTER — Encounter: Payer: Self-pay | Admitting: Podiatry

## 2021-01-24 NOTE — Progress Notes (Signed)
  Subjective:  Patient ID: [name], female    DOB: 1925-02-13,  MRN: [id number]  [name] presents to clinic today for callus(es) left foot and painful thick toenails that are difficult to trim. Painful toenails interfere with ambulation. Aggravating factors include wearing enclosed shoe gear. Pain is relieved with periodic professional debridement. Painful calluses are aggravated when weightbearing with and without shoegear. Pain is relieved with periodic professional debridement.  Her son is present during today's visit.  PCP is [name], MD .  Allergies  Allergen Reactions   Iodine Hives    Review of Systems: Negative except as noted in the HPI. Objective:   Constitutional [name] is a pleasant 87 y.o. Caucasian female, WD, WN in NAD. AAO x 3.   Vascular Capillary refill time to digits immediate b/l. Palpable pedal pulses b/l LE. Pedal hair sparse. Lower extremity skin temperature gradient within normal limits. No cyanosis or clubbing noted.  Neurologic Normal speech. Oriented to person, place, and time. Protective sensation decreased with 10 gram monofilament b/l.  Dermatologic Pedal skin with normal turgor, texture and tone b/l lower extremities. No open wounds b/l lower extremities. No interdigital macerations b/l lower extremities. Toenails 1-5 b/l elongated, discolored, dystrophic, thickened, crumbly with subungual debris and tenderness to dorsal palpation. Hyperkeratotic lesion(s) submet head 1 left foot.  No erythema, no edema, no drainage, no fluctuance.  Orthopedic: Normal muscle strength 5/5 to all lower extremity muscle groups bilaterally. Hallux valgus with bunion deformity noted b/l lower extremities. Pes planus deformity noted b/l lower extremities.   Radiographs: None Assessment:   1. Pain due to onychomycosis of toenails of both feet   2. Callus    Plan:  -Examined patient. -Medicare ABN signed for this year. Patient consents for  services of paring of callus  today. Copy has been placed in patient's chart. -Patient to continue soft, supportive shoe gear daily. -Toenails 1-5 b/l were debrided in length and girth with sterile nail nippers and dremel without iatrogenic bleeding.  -Patient to report any pedal injuries to medical professional immediately. -Patient/POA to call should there be question/concern in the interim.  Return in about 3 months (around 04/23/2021).  Freddie Breech, DPM

## 2021-02-09 ENCOUNTER — Emergency Department (HOSPITAL_COMMUNITY): Payer: Medicare Other

## 2021-02-09 ENCOUNTER — Encounter (HOSPITAL_COMMUNITY): Payer: Self-pay | Admitting: *Deleted

## 2021-02-09 ENCOUNTER — Observation Stay (HOSPITAL_COMMUNITY)
Admission: EM | Admit: 2021-02-09 | Discharge: 2021-02-11 | Disposition: A | Discharge status: Home / Self Care | Payer: Medicare Other | Attending: Emergency Medicine | Admitting: Emergency Medicine

## 2021-02-09 ENCOUNTER — Other Ambulatory Visit: Payer: Self-pay

## 2021-02-09 DIAGNOSIS — Z79899 Other long term (current) drug therapy: Secondary | ICD-10-CM | POA: Insufficient documentation

## 2021-02-09 DIAGNOSIS — G9341 Metabolic encephalopathy: Principal | ICD-10-CM | POA: Insufficient documentation

## 2021-02-09 DIAGNOSIS — E876 Hypokalemia: Secondary | ICD-10-CM | POA: Diagnosis not present

## 2021-02-09 DIAGNOSIS — I5032 Chronic diastolic (congestive) heart failure: Secondary | ICD-10-CM | POA: Diagnosis not present

## 2021-02-09 DIAGNOSIS — Z20822 Contact with and (suspected) exposure to covid-19: Secondary | ICD-10-CM | POA: Insufficient documentation

## 2021-02-09 DIAGNOSIS — N1831 Chronic kidney disease, stage 3a: Secondary | ICD-10-CM | POA: Diagnosis not present

## 2021-02-09 DIAGNOSIS — I10 Essential (primary) hypertension: Secondary | ICD-10-CM | POA: Diagnosis present

## 2021-02-09 DIAGNOSIS — I2510 Atherosclerotic heart disease of native coronary artery without angina pectoris: Secondary | ICD-10-CM | POA: Diagnosis not present

## 2021-02-09 DIAGNOSIS — M6281 Muscle weakness (generalized): Secondary | ICD-10-CM | POA: Diagnosis not present

## 2021-02-09 DIAGNOSIS — Z7982 Long term (current) use of aspirin: Secondary | ICD-10-CM | POA: Insufficient documentation

## 2021-02-09 DIAGNOSIS — G928 Other toxic encephalopathy: Secondary | ICD-10-CM | POA: Diagnosis present

## 2021-02-09 DIAGNOSIS — J189 Pneumonia, unspecified organism: Secondary | ICD-10-CM

## 2021-02-09 DIAGNOSIS — R55 Syncope and collapse: Secondary | ICD-10-CM | POA: Diagnosis present

## 2021-02-09 DIAGNOSIS — I130 Hypertensive heart and chronic kidney disease with heart failure and stage 1 through stage 4 chronic kidney disease, or unspecified chronic kidney disease: Secondary | ICD-10-CM | POA: Insufficient documentation

## 2021-02-09 DIAGNOSIS — K219 Gastro-esophageal reflux disease without esophagitis: Secondary | ICD-10-CM | POA: Diagnosis present

## 2021-02-09 DIAGNOSIS — R4182 Altered mental status, unspecified: Secondary | ICD-10-CM

## 2021-02-09 LAB — CBC WITH DIFFERENTIAL/PLATELET
Abs Immature Granulocytes: 0.02 10*3/uL (ref 0.00–0.07)
Basophils Absolute: 0 10*3/uL (ref 0.0–0.1)
Basophils Relative: 1 %
Eosinophils Absolute: 0.1 10*3/uL (ref 0.0–0.5)
Eosinophils Relative: 1 %
HCT: 36.2 % (ref 36.0–46.0)
Hemoglobin: 11 g/dL — ABNORMAL LOW (ref 12.0–15.0)
Immature Granulocytes: 0 %
Lymphocytes Relative: 18 %
Lymphs Abs: 1.1 10*3/uL (ref 0.7–4.0)
MCH: 21.8 pg — ABNORMAL LOW (ref 26.0–34.0)
MCHC: 30.4 g/dL (ref 30.0–36.0)
MCV: 71.7 fL — ABNORMAL LOW (ref 80.0–100.0)
Monocytes Absolute: 0.4 10*3/uL (ref 0.1–1.0)
Monocytes Relative: 7 %
Neutro Abs: 4.5 10*3/uL (ref 1.7–7.7)
Neutrophils Relative %: 73 %
Platelets: 211 10*3/uL (ref 150–400)
RBC: 5.05 MIL/uL (ref 3.87–5.11)
RDW: 16.2 % — ABNORMAL HIGH (ref 11.5–15.5)
WBC: 6.2 10*3/uL (ref 4.0–10.5)
nRBC: 0 % (ref 0.0–0.2)

## 2021-02-09 LAB — COMPREHENSIVE METABOLIC PANEL
ALT: 10 U/L (ref 0–44)
AST: 17 U/L (ref 15–41)
Albumin: 3.2 g/dL — ABNORMAL LOW (ref 3.5–5.0)
Alkaline Phosphatase: 62 U/L (ref 38–126)
Anion gap: 9 (ref 5–15)
BUN: 17 mg/dL (ref 8–23)
CO2: 21 mmol/L — ABNORMAL LOW (ref 22–32)
Calcium: 8.9 mg/dL (ref 8.9–10.3)
Chloride: 107 mmol/L (ref 98–111)
Creatinine, Ser: 0.94 mg/dL (ref 0.44–1.00)
GFR, Estimated: 56 mL/min — ABNORMAL LOW (ref >60)
Glucose, Bld: 145 mg/dL — ABNORMAL HIGH (ref 70–99)
Potassium: 3.1 mmol/L — ABNORMAL LOW (ref 3.5–5.1)
Sodium: 137 mmol/L (ref 135–145)
Total Bilirubin: 0.6 mg/dL (ref 0.3–1.2)
Total Protein: 6.3 g/dL — ABNORMAL LOW (ref 6.5–8.1)

## 2021-02-09 LAB — TROPONIN I (HIGH SENSITIVITY)
Troponin I (High Sensitivity): 11 ng/L (ref <18)
Troponin I (High Sensitivity): 13 ng/L (ref <18)

## 2021-02-09 MED ORDER — SODIUM CHLORIDE 0.9 % IV BOLUS
1000.0000 mL | Freq: Once | INTRAVENOUS | Status: AC
  Administered 2021-02-10: 1000 mL via INTRAVENOUS

## 2021-02-09 MED ORDER — ONDANSETRON HCL 4 MG/2ML IJ SOLN
4.0000 mg | Freq: Once | INTRAMUSCULAR | Status: AC
  Administered 2021-02-10: 4 mg via INTRAVENOUS
  Filled 2021-02-09: qty 2

## 2021-02-09 NOTE — ED Notes (Signed)
Patient transported to MRI 

## 2021-02-09 NOTE — ED Provider Notes (Signed)
[facility] EMERGENCY DEPARTMENT Provider Note   CSN: [id number] Arrival date & time: 02/09/21  1958     History Chief Complaint  Patient presents with   Near Syncope    [name] is a 87 y.o. female.  The history is provided by the patient and medical records. No language interpreter was used.  Near Syncope This is a new problem. The problem occurs rarely. The problem has not changed since onset.Pertinent negatives include no chest pain, no abdominal pain, no headaches and no shortness of breath. Nothing aggravates the symptoms. Nothing relieves the symptoms. She has tried nothing for the symptoms. The treatment provided no relief.      Past Medical History:  Diagnosis Date   Diverticular disease    GERD (gastroesophageal reflux disease)    Hiatal hernia    High cholesterol    Hypertension     Patient Active Problem List   Diagnosis Date Noted   Advanced age 64/07/2020   Infection of toenail 11/15/2020   Pain in limb 11/15/2020   Xerosis cutis 01/06/2020   Adult failure to thrive syndrome 10/18/2019   Chest pain 04/19/2019   Tear film insufficiency 04/19/2019   Shoulder joint pain 02/20/2017   Hypertensive heart disease without congestive heart failure 12/19/2014   Atherosclerotic heart disease of native coronary artery without angina pectoris 05/04/2014   Cardiomegaly 03/14/2014   Encounter for screening for other disorder 03/07/2013   Syncope 09/29/2012   Hypertension 09/29/2012   Hyperlipidemia 09/29/2012   Anemia 09/29/2012   Chronic abdominal pain 09/29/2012   Varicose veins of unspecified lower extremity with other complications 05/30/2011   Vitamin D deficiency 05/30/2011   Disequilibrium 12/21/2009   Age-related osteoporosis without current pathological fracture 04/18/2009   Gastro-esophageal reflux disease without esophagitis 04/18/2009   Osteoarthritis 04/18/2009    Past Surgical History:  Procedure Laterality Date    ABDOMINAL HYSTERECTOMY     HYSTEROTOMY     LIVER BIOPSY       OB History   No obstetric history on file.     Family History  Family history unknown: Yes    Social History   Tobacco Use   Smoking status: Never   Smokeless tobacco: Never  Vaping Use   Vaping Use: Never used  Substance Use Topics   Alcohol use: No   Drug use: No    Home Medications Prior to Admission medications   Medication Sig Start Date End Date Taking? Authorizing Provider  amLODipine (NORVASC) 10 MG tablet Take 10 mg by mouth daily.    [provider]  aspirin 81 MG chewable tablet Take one (1) tablet by mouth daily 02/12/10   [provider]  atorvastatin (LIPITOR) 80 MG tablet Take 80 mg by mouth daily.    [provider]  calcium-vitamin D (OSCAL WITH D) 500-200 MG-UNIT per tablet Take 1 tablet by mouth daily.    [provider]  cholecalciferol (VITAMIN D) 1000 UNITS tablet Take 2,000 Units by mouth daily.    [provider]  diclofenac Sodium (VOLTAREN) 1 % GEL See admin instructions. 10/05/15   [provider]  doxycycline (VIBRA-TABS) 100 MG tablet Take 1 tablet po 2x a day x 5 days 11/09/20   [provider]  Ferrous Sulfate (IRON) 325 (65 Fe) MG TABS Take 1 tablet by mouth daily. 04/18/09   [provider]  hydrALAZINE (APRESOLINE) 50 MG tablet Take 1 tablet (50 mg total) by mouth 2 (two) times daily. 03/01/20  Meredeth Ide, MD  losartan (COZAAR) 25 MG tablet Take 50 mg by mouth daily. 09/14/19   [provider]  meclizine (ANTIVERT) 25 MG tablet one po q 6 hrs prn vertigo 09/13/12   [provider]  Multiple Vitamin (MULITIVITAMIN WITH MINERALS) TABS Take 1 tablet by mouth daily.    [provider]  Omega-3 Fatty Acids (FISH OIL) 1000 MG CAPS Take 1,000 mg by mouth daily.     [provider]  pantoprazole (PROTONIX) 20 MG tablet Take 1 tablet (20 mg total) by mouth daily. 12/28/19   Terrilee Files, MD  polyvinyl alcohol (LIQUIFILM TEARS) 1.4 % ophthalmic solution Place 1 drop into both eyes as needed for dry eyes.    [provider]    Allergies    Iodine  Review of Systems   Review of Systems  Unable to perform ROS: Mental status change  Constitutional:  Positive for fatigue.  HENT:  Negative for congestion.   Respiratory:  Negative for cough, chest tightness and shortness of breath.   Cardiovascular:  Positive for near-syncope. Negative for chest pain.  Gastrointestinal:  Positive for nausea. Negative for abdominal pain, constipation, diarrhea and vomiting.  Genitourinary:  Negative for dysuria, flank pain and frequency.  Musculoskeletal:  Negative for back pain and neck pain.  Skin:  Negative for wound.  Neurological:  Positive for light-headedness. Negative for syncope (likely  near syncope) and headaches.  Psychiatric/Behavioral:  Negative for agitation.    Physical Exam Updated Vital Signs BP 140/72 (BP Location: Right Arm)   Pulse 78   Temp (!) 97.4 F (36.3 C)   Resp 16   SpO2 100%   Physical Exam Vitals and nursing note reviewed.  Constitutional:      General: She is not in acute distress.    Appearance: She is well-developed. She is not ill-appearing, toxic-appearing or diaphoretic.  HENT:     Head: Normocephalic and atraumatic.     Mouth/Throat:     Mouth: Mucous membranes are dry.     Pharynx: No oropharyngeal exudate or posterior oropharyngeal erythema.  Eyes:     Extraocular Movements: Extraocular movements intact.     Conjunctiva/sclera: Conjunctivae normal.     Pupils: Pupils are equal, round, and reactive to light.  Neck:     Vascular: No carotid bruit.  Cardiovascular:     Rate and Rhythm: Normal rate and regular rhythm.     Heart sounds: No murmur heard. Pulmonary:     Effort: Pulmonary effort is normal. No respiratory distress.     Breath sounds: Normal breath sounds. No wheezing, rhonchi or rales.  Chest:     Chest  wall: No tenderness.  Abdominal:     General: Abdomen is flat.     Palpations: Abdomen is soft.     Tenderness: There is no abdominal tenderness.  Musculoskeletal:        General: No tenderness.     Cervical back: Neck supple. No tenderness.  Skin:    General: Skin is warm and dry.     Findings: No erythema.  Neurological:     Mental Status: She is alert. She is disoriented.     Sensory: No sensory deficit.     Motor: No weakness.    ED Results / Procedures / Treatments   Labs (all labs ordered are listed, but only abnormal results are displayed) Labs Reviewed  CBC WITH DIFFERENTIAL/PLATELET - Abnormal; Notable for the following components:  Result Value   Hemoglobin 11.0 (*)    MCV 71.7 (*)    MCH 21.8 (*)    RDW 16.2 (*)    All other components within normal limits  COMPREHENSIVE METABOLIC PANEL - Abnormal; Notable for the following components:   Potassium 3.1 (*)    CO2 21 (*)    Glucose, Bld 145 (*)    Total Protein 6.3 (*)    Albumin 3.2 (*)    GFR, Estimated 56 (*)    All other components within normal limits  URINE CULTURE  URINALYSIS, ROUTINE W REFLEX MICROSCOPIC  TSH  AMMONIA  TROPONIN I (HIGH SENSITIVITY)  TROPONIN I (HIGH SENSITIVITY)    EKG EKG Interpretation  Date/Time:  Saturday February 09 2021 20:22:56 EDT Ventricular Rate:  78 PR Interval:  164 QRS Duration: 102 QT Interval:  388 QTC Calculation: 442 R Axis:   44 Text Interpretation: Sinus rhythm Atrial premature complexes Posterior infarct, old Borderline T abnormalities, inferior leads when compared to prior, similar appearance. no STEMI Confirmed by Theda Belfastegeler, Chris ([id number]) on 02/09/2021 9:28:01 PM  Radiology CT HEAD WO CONTRAST (5MM)  Result Date: 02/09/2021 CLINICAL DATA:  Delirium. EXAM: CT HEAD WITHOUT CONTRAST TECHNIQUE: Contiguous axial images were obtained from the base of the skull through the vertex without intravenous contrast. COMPARISON:  February 27, 2020 FINDINGS: Brain:  There is mild cerebral atrophy with widening of the extra-axial spaces and ventricular dilatation. There are areas of decreased attenuation within the white matter tracts of the supratentorial brain, consistent with microvascular disease changes. Vascular: No hyperdense vessel or unexpected calcification. Skull: Normal. Negative for fracture or focal lesion. Sinuses/Orbits: No acute finding. Other: None. IMPRESSION: 1. Generalized cerebral atrophy. 2. No acute intracranial abnormality. Electronically Signed   By: Aram Candelahaddeus  Houston M.D.   On: 02/09/2021 20:25   DG Chest Portable 1 View  Result Date: 02/09/2021 CLINICAL DATA:  Altered mental status.  Syncope. EXAM: PORTABLE CHEST 1 VIEW.  Patient markedly rotated. COMPARISON:  Chest x-ray 02/27/2020, CT chest 03/01/2020 FINDINGS: Limited evaluation of the heart and mediastinal contours due to patient rotation. No focal consolidation. No pulmonary edema. No pleural effusion. No pneumothorax. No acute osseous abnormality. IMPRESSION: 1. Marked patient rotation with limited evaluation of the cardiomediastinal silhouette. Recommend repeat PA and lateral view of the chest. 2. No definite focal consolidation. Electronically Signed   By: Tish FredericksonMorgane  Naveau M.D.   On: 02/09/2021 23:09    Procedures Procedures   Medications Ordered in ED Medications  sodium chloride 0.9 % bolus 1,000 mL (1,000 mLs Intravenous New Bag/Given 02/10/21 0035)  ondansetron (ZOFRAN) injection 4 mg (4 mg Intravenous Given 02/10/21 0035)    ED Course  I have reviewed the triage vital signs and the nursing notes.  Pertinent labs & imaging results that were available during my care of the patient were reviewed by me and considered in my medical decision making (see chart for details).    MDM Rules/Calculators/A&P                           Dominga FerryDorothy Horton Ranum is a 87 y.o. female with a past medical history significant for hypertension, hyperlipidemia, GERD, diverticular disease,  hypercholesterolemia, and osteoarthritis who presents for near syncope and altered mental status.  According to nursing report from EMS, patient had a syncopal versus near syncopal episode today and was brought in as she was not feeling well.  Patient is repeating that she "feels sick" and that  something feels wrong.  She is denying any focal headache, neck pain, chest pain, abdominal pain.  She denies any vomiting but does report nausea.  She reports she is feeling sick.  She also told her family that she was smelling something different today that was burning.  She denies any medication changes.  She denies any trauma.  She reportedly did not fall and hit her head.  She is denying any urinary changes, constipation, or diarrhea.  Family reports that she is very independent and with it and lives in a house with a great grandson.  She per family is normally writing all of her own checks and is able to get around.  This is a drastic change for her.  She is repetitively saying the same things and saying that she is sick.  They feel her speech is off.  They deny any history of stroke.  On exam, lungs are clear and chest is nontender.  Abdomen is nontender.  She moving all extremities.  Speech is repetitive but clear for me.  Pupils are symmetric reactive normal extract movements.  Normal sensation and strength in extremities.  No evidence of trauma seen initially.  Family was concerned the patient may be dehydrated and she did indeed have some dry mucous membranes in her mouth.  Will give some fluids for rehydration and will order some nausea medicine for her.  Patient had imaging in triage with a CT scan which did not show acute abnormality.  With the repetitive speech and altered mental status, I am concerned about possible stroke.  We will get MRI brain without contrast.  Due to the burning sensation that she was smelling and these altered mental status with speech difficulties, will call neurology to see if  they recommend any work-up to rule out seizures.  We will get screening labs and work-up for occult infection.  Due to her altered mental status from baseline, anticipate admission when work-up is completed.     11:59 PM Spoke with neurology who did not feel that the small abnormality was necessarily indicative of seizures.  He agreed with MRI and likely admission.  If there is any other seizure activity, anticipate touching base with neurology to discuss the benefit of EEG collection.  Care transferred to oncoming team awaiting for reassessment after work-up is completed.   Final Clinical Impression(s) / ED Diagnoses Final diagnoses:  Near syncope  Altered mental status, unspecified altered mental status type       Clinical Impression: 1. Near syncope   2. Altered mental status, unspecified altered mental status type     Disposition: Care transferred to oncoming team awaiting for reassessment after work-up is completed.  This note was prepared with assistance of Conservation officer, historic buildings. Occasional wrong-word or sound-a-like substitutions may have occurred due to the inherent limitations of voice recognition software.     Corbin Falck, Canary Brim, MD 02/10/21 [phone]

## 2021-02-09 NOTE — ED Provider Notes (Signed)
Emergency Medicine Provider Triage Evaluation Note  [name] , a 87 y.o. female  was evaluated in triage.  Pt complains of syncope. Family feels patient is dehydrated. Possible altered per EMS  Review of Systems  Positive: syncope Negative: Fever, emesis  Physical Exam  There were no vitals taken for this visit. Gen:   Awake, no distress   Resp:  Normal effort  MSK:   Moves extremities without difficulty  Other:    Medical Decision Making  Medically screening exam initiated at 8:01 PM.  Appropriate orders placed.  Dominga Ferry was informed that the remainder of the evaluation will be completed by another provider, this initial triage assessment does not replace that evaluation, and the importance of remaining in the ED until their evaluation is complete.  Syncope   Cale Bethard A, PA-C 02/09/21 2001    Terald Sleeper, MD 02/09/21 2045

## 2021-02-09 NOTE — ED Triage Notes (Signed)
Pt from home with EMS for syncopal episode at home, witness by family. At present, pt alert to self only, generally weak all over

## 2021-02-09 NOTE — ED Triage Notes (Signed)
Pt here from home with c/o near syncopal /weakness, cbg 111,

## 2021-02-10 ENCOUNTER — Encounter (HOSPITAL_COMMUNITY): Payer: Self-pay | Admitting: Internal Medicine

## 2021-02-10 ENCOUNTER — Observation Stay (HOSPITAL_COMMUNITY): Payer: Medicare Other

## 2021-02-10 ENCOUNTER — Other Ambulatory Visit: Payer: Self-pay

## 2021-02-10 DIAGNOSIS — R4182 Altered mental status, unspecified: Secondary | ICD-10-CM

## 2021-02-10 DIAGNOSIS — N1831 Chronic kidney disease, stage 3a: Secondary | ICD-10-CM

## 2021-02-10 DIAGNOSIS — K219 Gastro-esophageal reflux disease without esophagitis: Secondary | ICD-10-CM

## 2021-02-10 DIAGNOSIS — R55 Syncope and collapse: Secondary | ICD-10-CM

## 2021-02-10 DIAGNOSIS — G9341 Metabolic encephalopathy: Secondary | ICD-10-CM | POA: Diagnosis not present

## 2021-02-10 DIAGNOSIS — I10 Essential (primary) hypertension: Secondary | ICD-10-CM

## 2021-02-10 DIAGNOSIS — I5032 Chronic diastolic (congestive) heart failure: Secondary | ICD-10-CM | POA: Diagnosis not present

## 2021-02-10 DIAGNOSIS — G928 Other toxic encephalopathy: Secondary | ICD-10-CM | POA: Diagnosis present

## 2021-02-10 LAB — AMMONIA: Ammonia: <9 umol/L — ABNORMAL LOW (ref 9–35)

## 2021-02-10 LAB — COMPREHENSIVE METABOLIC PANEL
ALT: 12 U/L (ref 0–44)
AST: 30 U/L (ref 15–41)
Albumin: 3.1 g/dL — ABNORMAL LOW (ref 3.5–5.0)
Alkaline Phosphatase: 66 U/L (ref 38–126)
Anion gap: 7 (ref 5–15)
BUN: 11 mg/dL (ref 8–23)
CO2: 23 mmol/L (ref 22–32)
Calcium: 9 mg/dL (ref 8.9–10.3)
Chloride: 108 mmol/L (ref 98–111)
Creatinine, Ser: 0.81 mg/dL (ref 0.44–1.00)
GFR, Estimated: >60 mL/min (ref >60)
Glucose, Bld: 115 mg/dL — ABNORMAL HIGH (ref 70–99)
Potassium: 3.8 mmol/L (ref 3.5–5.1)
Sodium: 138 mmol/L (ref 135–145)
Total Bilirubin: 0.4 mg/dL (ref 0.3–1.2)
Total Protein: 6.6 g/dL (ref 6.5–8.1)

## 2021-02-10 LAB — URINALYSIS, ROUTINE W REFLEX MICROSCOPIC
Bilirubin Urine: NEGATIVE
Glucose, UA: NEGATIVE mg/dL
Hgb urine dipstick: NEGATIVE
Ketones, ur: 5 mg/dL — AB
Leukocytes,Ua: NEGATIVE
Nitrite: NEGATIVE
Protein, ur: NEGATIVE mg/dL
Specific Gravity, Urine: 1.009 (ref 1.005–1.030)
pH: 9 — ABNORMAL HIGH (ref 5.0–8.0)

## 2021-02-10 LAB — CBC WITH DIFFERENTIAL/PLATELET
Abs Immature Granulocytes: 0.02 10*3/uL (ref 0.00–0.07)
Basophils Absolute: 0 10*3/uL (ref 0.0–0.1)
Basophils Relative: 0 %
Eosinophils Absolute: 0 10*3/uL (ref 0.0–0.5)
Eosinophils Relative: 0 %
HCT: 36.5 % (ref 36.0–46.0)
Hemoglobin: 11.2 g/dL — ABNORMAL LOW (ref 12.0–15.0)
Immature Granulocytes: 0 %
Lymphocytes Relative: 14 %
Lymphs Abs: 1 10*3/uL (ref 0.7–4.0)
MCH: 22.1 pg — ABNORMAL LOW (ref 26.0–34.0)
MCHC: 30.7 g/dL (ref 30.0–36.0)
MCV: 72.1 fL — ABNORMAL LOW (ref 80.0–100.0)
Monocytes Absolute: 0.5 10*3/uL (ref 0.1–1.0)
Monocytes Relative: 6 %
Neutro Abs: 5.7 10*3/uL (ref 1.7–7.7)
Neutrophils Relative %: 80 %
Platelets: 261 10*3/uL (ref 150–400)
RBC: 5.06 MIL/uL (ref 3.87–5.11)
RDW: 16.4 % — ABNORMAL HIGH (ref 11.5–15.5)
WBC: 7.2 10*3/uL (ref 4.0–10.5)
nRBC: 0 % (ref 0.0–0.2)

## 2021-02-10 LAB — VITAMIN B12: Vitamin B-12: 192 pg/mL (ref 180–914)

## 2021-02-10 LAB — MAGNESIUM: Magnesium: 2 mg/dL (ref 1.7–2.4)

## 2021-02-10 LAB — TSH: TSH: 1.333 u[IU]/mL (ref 0.350–4.500)

## 2021-02-10 LAB — RPR: RPR Ser Ql: NONREACTIVE

## 2021-02-10 LAB — SEDIMENTATION RATE: Sed Rate: 37 mm/hr — ABNORMAL HIGH (ref 0–22)

## 2021-02-10 LAB — SARS CORONAVIRUS 2 (TAT 6-24 HRS): SARS Coronavirus 2: NEGATIVE

## 2021-02-10 MED ORDER — ACETAMINOPHEN 650 MG RE SUPP: 650.0000 mg | Freq: Four times a day (QID) | RECTAL | Status: DC | PRN

## 2021-02-10 MED ORDER — POTASSIUM CHLORIDE CRYS ER 20 MEQ PO TBCR
40.0000 meq | EXTENDED_RELEASE_TABLET | Freq: Once | ORAL | Status: AC
  Administered 2021-02-10: 40 meq via ORAL
  Filled 2021-02-10: qty 2

## 2021-02-10 MED ORDER — ACETAMINOPHEN 325 MG PO TABS: 650.0000 mg | ORAL_TABLET | Freq: Four times a day (QID) | ORAL | Status: DC | PRN

## 2021-02-10 MED ORDER — ATORVASTATIN CALCIUM 80 MG PO TABS
80.0000 mg | ORAL_TABLET | Freq: Every day | ORAL | Status: DC
  Administered 2021-02-10 – 2021-02-11 (×2): 80 mg via ORAL
  Filled 2021-02-10 (×2): qty 1

## 2021-02-10 MED ORDER — PANTOPRAZOLE SODIUM 20 MG PO TBEC
20.0000 mg | DELAYED_RELEASE_TABLET | Freq: Every day | ORAL | Status: DC
  Administered 2021-02-10 – 2021-02-11 (×2): 20 mg via ORAL
  Filled 2021-02-10 (×3): qty 1

## 2021-02-10 MED ORDER — ONDANSETRON HCL 4 MG PO TABS: 4.0000 mg | ORAL_TABLET | Freq: Four times a day (QID) | ORAL | Status: DC | PRN

## 2021-02-10 MED ORDER — ASPIRIN 81 MG PO CHEW
81.0000 mg | CHEWABLE_TABLET | Freq: Every day | ORAL | Status: DC
  Administered 2021-02-10 – 2021-02-11 (×2): 81 mg via ORAL
  Filled 2021-02-10 (×2): qty 1

## 2021-02-10 MED ORDER — POLYVINYL ALCOHOL 1.4 % OP SOLN: 1.0000 [drp] | OPHTHALMIC | Status: DC | PRN

## 2021-02-10 MED ORDER — HYDRALAZINE HCL 50 MG PO TABS
50.0000 mg | ORAL_TABLET | Freq: Two times a day (BID) | ORAL | Status: DC
  Administered 2021-02-10 – 2021-02-11 (×3): 50 mg via ORAL
  Filled 2021-02-10 (×3): qty 1

## 2021-02-10 MED ORDER — POLYETHYLENE GLYCOL 3350 17 G PO PACK: 17.0000 g | PACK | Freq: Every day | ORAL | Status: DC | PRN

## 2021-02-10 MED ORDER — ENOXAPARIN SODIUM 40 MG/0.4ML IJ SOSY
40.0000 mg | PREFILLED_SYRINGE | Freq: Every day | INTRAMUSCULAR | Status: DC
  Administered 2021-02-10 – 2021-02-11 (×2): 40 mg via SUBCUTANEOUS
  Filled 2021-02-10 (×2): qty 0.4

## 2021-02-10 MED ORDER — LACTATED RINGERS IV SOLN: INTRAVENOUS | Status: AC

## 2021-02-10 MED ORDER — AMLODIPINE BESYLATE 10 MG PO TABS
10.0000 mg | ORAL_TABLET | Freq: Every day | ORAL | Status: DC
  Administered 2021-02-10 – 2021-02-11 (×2): 10 mg via ORAL
  Filled 2021-02-10: qty 2
  Filled 2021-02-10: qty 1

## 2021-02-10 MED ORDER — CYANOCOBALAMIN 1000 MCG/ML IJ SOLN
1000.0000 ug | Freq: Every day | INTRAMUSCULAR | Status: DC
  Administered 2021-02-10 – 2021-02-11 (×2): 1000 ug via SUBCUTANEOUS
  Filled 2021-02-10 (×2): qty 1

## 2021-02-10 MED ORDER — HYDRALAZINE HCL 20 MG/ML IJ SOLN: 10.0000 mg | Freq: Four times a day (QID) | INTRAMUSCULAR | Status: DC | PRN

## 2021-02-10 MED ORDER — ONDANSETRON HCL 4 MG/2ML IJ SOLN: 4.0000 mg | Freq: Four times a day (QID) | INTRAMUSCULAR | Status: DC | PRN

## 2021-02-10 MED ORDER — LOSARTAN POTASSIUM 50 MG PO TABS
50.0000 mg | ORAL_TABLET | Freq: Every day | ORAL | Status: DC
  Administered 2021-02-10 – 2021-02-11 (×2): 50 mg via ORAL
  Filled 2021-02-10 (×2): qty 1

## 2021-02-10 NOTE — Procedures (Signed)
Patient Name: [name]  MRN: [id number]  Epilepsy Attending: Charlsie Quest  Referring Physician/Provider: Dr Caryl Pina Date: 02/10/2021 Duration: 24.05 mins  Patient history:  87 year old female presenting with AMS. EEG to evaluate for seizure  Level of alertness: Awake, asleep  AEDs during EEG study: None  Technical aspects: This EEG study was done with scalp electrodes positioned according to the 10-20 International system of electrode placement. Electrical activity was acquired at a sampling rate of 500Hz  and reviewed with a high frequency filter of 70Hz  and a low frequency filter of 1Hz . EEG data were recorded continuously and digitally stored.   Description: The posterior dominant rhythm consists of 9-10 Hz activity of moderate voltage (25-35 uV) seen predominantly in posterior head regions, symmetric and reactive to eye opening and eye closing. Sleep was characterized by vertex waves, sleep spindles (12 to 14 Hz), maximal frontocentral region.  Physiologic photic driving was not seen during photic stimulation.  Hyperventilation was not performed.     IMPRESSION: This study is within normal limits. No seizures or epileptiform discharges were seen throughout the recording.  Andreas Sobolewski 

## 2021-02-10 NOTE — Plan of Care (Signed)
Discussed with primary team, improving mental status and EEG and MRI brain are reassuring.  At this time do not suspect a primary neurological etiology, may be related to hypertensive encephalopathy given her labile blood pressures here.  Neurology will be available on as-needed basis, please reconsult if new questions arise Brooke Dare MD-PhD [facility] [phone]  Available 7 AM to 7 PM, outside these hours please contact Neurologist on call listed on AMION

## 2021-02-10 NOTE — H&P (Addendum)
History and Physical    [name] ZOX:[id number] DOB: 1924/10/03 DOA: 02/09/2021  PCP: Shon Baton, MD  Patient coming from: Home via EMS   Chief Complaint:  Chief Complaint  Patient presents with   Near Syncope     HPI:    87 year old female with past medical history of hypertension, chronic kidney disease stage IIIa, gastroesophageal reflux disease, hyperlipidemia, diastolic congestive heart failure (Echo 02/2020 EF 60-65% with G1DD) presenting to [facility] emergency department due to complaints of generalized weakness.  Patient is a somewhat poor historian due to suspected encephalopathy.  Attempts were made to contact the son via the listed phone number but have been unsuccessful.  In addition to obtaining history from the patient, history is also been obtained from emergency department staff as well as review of emergency department and EMS notes.  Patient explains that she has been feeling "weak all over" as of late.  There is additionally a period of time where patient was thought to be unable to speak.  Patient is unable to provide further details.  Review of EMS notes states that most of this weakness was observed by the grandson whom the patient resides with.  Notes also indicate that patient has been exhibiting increasing lethargy and confusion as of late as well.  Patient denies any fever, nausea, vomiting dysuria, shortness of breath, cough or sick contacts.  It is worth noting that family reported to ER staff that patient is awake alert and oriented x3 and able to perform most ADLs on her own at baseline.  Grandson contacted EMS who promptly came to evaluate the patient and brought her into [facility] for evaluation.  Upon evaluation in the emergency department, MRI of the brain revealed a concern for a 1 cm focus of diffusion involving the right dorsal pons concerning for possible stroke.  Remainder of laboratory work-up revealed an unremarkable  urinalysis, unremarkable TSH and mild hypokalemia on chemistry.  Due to concerns for possible stroke by the ER provider as well as ongoing weakness and confusion in the emergency department Case was discussed with Dr. Cheral Marker with neurology who agreed to come see the patient in consultation.  The hospitalist group was also called to assess the patient for admission to the hospital.    Review of Systems:   Review of Systems  Unable to perform ROS: Mental acuity   Past Medical History:  Diagnosis Date   Diverticular disease    GERD (gastroesophageal reflux disease)    Hiatal hernia    High cholesterol    Hypertension     Past Surgical History:  Procedure Laterality Date   ABDOMINAL HYSTERECTOMY     HYSTEROTOMY     LIVER BIOPSY       reports that she has never smoked. She has never used smokeless tobacco. She reports that she does not drink alcohol and does not use drugs.  Allergies  Allergen Reactions   Iodine Hives    Family History  Problem Relation Age of Onset   Heart disease Neg Hx      Prior to Admission medications   Medication Sig Start Date End Date Taking? Authorizing Provider  amLODipine (NORVASC) 10 MG tablet Take 10 mg by mouth daily.    [provider]  aspirin 81 MG chewable tablet Take one (1) tablet by mouth daily 02/12/10   [provider]  atorvastatin (LIPITOR) 80 MG tablet Take 80 mg by mouth daily.    [provider]  calcium-vitamin D (OSCAL WITH D) 500-200 MG-UNIT per tablet Take 1 tablet by mouth daily.    [provider]  cholecalciferol (VITAMIN D) 1000 UNITS tablet Take 2,000 Units by mouth daily.    [provider]  diclofenac Sodium (VOLTAREN) 1 % GEL See admin instructions. 10/05/15   [provider]  doxycycline (VIBRA-TABS) 100 MG tablet Take 1 tablet po 2x a day x 5 days 11/09/20   [provider]  Ferrous Sulfate (IRON) 325 (65 Fe) MG TABS Take 1 tablet by mouth daily. 04/18/09    [provider]  hydrALAZINE (APRESOLINE) 50 MG tablet Take 1 tablet (50 mg total) by mouth 2 (two) times daily. 03/01/20   Oswald Hillock, MD  losartan (COZAAR) 25 MG tablet Take 50 mg by mouth daily. 09/14/19   [provider]  meclizine (ANTIVERT) 25 MG tablet one po q 6 hrs prn vertigo 09/13/12   [provider]  Multiple Vitamin (MULITIVITAMIN WITH MINERALS) TABS Take 1 tablet by mouth daily.    [provider]  Omega-3 Fatty Acids (FISH OIL) 1000 MG CAPS Take 1,000 mg by mouth daily.     [provider]  pantoprazole (PROTONIX) 20 MG tablet Take 1 tablet (20 mg total) by mouth daily. 12/28/19   Hayden Rasmussen, MD  polyvinyl alcohol (LIQUIFILM TEARS) 1.4 % ophthalmic solution Place 1 drop into both eyes as needed for dry eyes.    [provider]    Physical Exam: Vitals:   02/10/21 0545 02/10/21 0600 02/10/21 0615 02/10/21 0645  BP: (!) 173/58 (!) 175/81 (!) 163/63 (!) 156/72  Pulse: 73 72 63 62  Resp: (!) 21 (!) 21 15 15   Temp:      SpO2: 98% 98% 98% 98%    Constitutional: Lethargic but arousable and oriented x1, no associated distress.   Skin: no rashes, no lesions, somewhat poor skin turgor noted Eyes: Pupils are equally reactive to light.  No evidence of scleral icterus or conjunctival pallor.  ENMT: Slightly dry mucous membranes noted.  Posterior pharynx clear of any exudate or lesions.   Neck: normal, supple, no masses, no thyromegaly.  No evidence of jugular venous distension.   Respiratory: clear to auscultation bilaterally, no wheezing, no crackles. Normal respiratory effort. No accessory muscle use.  Cardiovascular: Regular rate and rhythm, no murmurs / rubs / gallops. No extremity edema. 2+ pedal pulses. No carotid bruits.  Chest:   Nontender without crepitus or deformity.   Back:   Nontender without crepitus or deformity. Abdomen: Abdomen is soft and nontender.  No evidence of intra-abdominal masses.  Positive bowel  sounds noted in all quadrants.   Musculoskeletal: No joint deformity upper and lower extremities. Good ROM, no contractures. Normal muscle tone.  Neurologic: Patient is lethargic arousable and oriented x1.  CN 2-12 grossly intact. Sensation intact.  Patient moving all 4 extremities spontaneously.  Patient is following all commands.  Patient is responsive to verbal stimuli.   Psychiatric: Patient exhibits normal mood with a flat affect.  Patient currently does not seem to possess insight as to her current situation.   Labs on Admission: I have personally reviewed following labs and imaging studies -   CBC: Recent Labs  Lab 02/09/21 2000  WBC 6.2  NEUTROABS 4.5  HGB 11.0*  HCT 36.2  MCV 71.7*  PLT 250   Basic Metabolic Panel: Recent Labs  Lab 02/09/21 2000  NA 137  K 3.1*  CL 107  CO2 21*  GLUCOSE  145*  BUN 17  CREATININE 0.94  CALCIUM 8.9   GFR: CrCl cannot be calculated (Unknown ideal weight.). Liver Function Tests: Recent Labs  Lab 02/09/21 2000  AST 17  ALT 10  ALKPHOS 62  BILITOT 0.6  PROT 6.3*  ALBUMIN 3.2*   No results for input(s): LIPASE, AMYLASE in the last 168 hours. No results for input(s): AMMONIA in the last 168 hours. Coagulation Profile: No results for input(s): INR, PROTIME in the last 168 hours. Cardiac Enzymes: No results for input(s): CKTOTAL, CKMB, CKMBINDEX, TROPONINI in the last 168 hours. BNP (last 3 results) No results for input(s): PROBNP in the last 8760 hours. HbA1C: No results for input(s): HGBA1C in the last 72 hours. CBG: No results for input(s): GLUCAP in the last 168 hours. Lipid Profile: No results for input(s): CHOL, HDL, LDLCALC, TRIG, CHOLHDL, LDLDIRECT in the last 72 hours. Thyroid Function Tests: Recent Labs    02/10/21 0049  TSH 1.333   Anemia Panel: No results for input(s): VITAMINB12, FOLATE, FERRITIN, TIBC, IRON, RETICCTPCT in the last 72 hours. Urine analysis:    Component Value Date/Time   COLORURINE  STRAW (A) 02/10/2021 0140   APPEARANCEUR CLEAR 02/10/2021 0140   LABSPEC 1.009 02/10/2021 0140   PHURINE 9.0 (H) 02/10/2021 0140   GLUCOSEU NEGATIVE 02/10/2021 0140   HGBUR NEGATIVE 02/10/2021 0140   BILIRUBINUR NEGATIVE 02/10/2021 0140   KETONESUR 5 (A) 02/10/2021 0140   PROTEINUR NEGATIVE 02/10/2021 0140   UROBILINOGEN 0.2 09/29/2012 1723   NITRITE NEGATIVE 02/10/2021 0140   LEUKOCYTESUR NEGATIVE 02/10/2021 0140    Radiological Exams on Admission - Personally Reviewed: CT HEAD WO CONTRAST (5MM)  Result Date: 02/09/2021 CLINICAL DATA:  Delirium. EXAM: CT HEAD WITHOUT CONTRAST TECHNIQUE: Contiguous axial images were obtained from the base of the skull through the vertex without intravenous contrast. COMPARISON:  February 27, 2020 FINDINGS: Brain: There is mild cerebral atrophy with widening of the extra-axial spaces and ventricular dilatation. There are areas of decreased attenuation within the white matter tracts of the supratentorial brain, consistent with microvascular disease changes. Vascular: No hyperdense vessel or unexpected calcification. Skull: Normal. Negative for fracture or focal lesion. Sinuses/Orbits: No acute finding. Other: None. IMPRESSION: 1. Generalized cerebral atrophy. 2. No acute intracranial abnormality. Electronically Signed   By: Virgina Norfolk M.D.   On: 02/09/2021 20:25   MR BRAIN WO CONTRAST  Result Date: 02/10/2021 CLINICAL DATA:  Initial evaluation for altered mental status. EXAM: MRI HEAD WITHOUT CONTRAST TECHNIQUE: Multiplanar, multiecho pulse sequences of the brain and surrounding structures were obtained without intravenous contrast. COMPARISON:  CT from 02/09/2021. FINDINGS: Brain: Examination degraded by motion artifact. Age-related cerebral atrophy. Mild T2/FLAIR hyperintensity involving the periventricular white matter most likely reflects chronic microvascular ischemic disease, overall mild in felt to be within normal limits for age. There is question  of a 1 cm focus of diffusion abnormality involving the right dorsal pons (series 2, image 18), not entirely certain as there is motion artifact through this region. No associated susceptibility artifact or mass effect. No other diffusion abnormality to suggest acute or subacute ischemia. Gray-white matter differentiation otherwise maintained. No other areas of chronic cortical infarction. No other acute or chronic intracranial hemorrhage. No mass lesion or midline shift. No hydrocephalus or extra-axial fluid collection. Empty sella noted. Vascular: Major intracranial vascular flow voids are grossly maintained at the skull base. Skull and upper cervical spine: Craniocervical junction within normal limits. Bone marrow signal intensity normal. No scalp soft tissue abnormality. Sinuses/Orbits: Prior bilateral ocular  lens replacement. Visualized paranasal sinuses and mastoid air cells are clear. Other: None. IMPRESSION: 1. Motion degraded exam. 2. Question 1 cm focus of diffusion abnormality involving the right dorsal pons, not entirely certain as there is motion artifact through this region. Correlation with history and symptomatology for possible acute ischemia at this location recommended. 3. No other acute intracranial abnormality. Electronically Signed   By: Jeannine Boga M.D.   On: 02/10/2021 01:00   DG Chest Portable 1 View  Result Date: 02/09/2021 CLINICAL DATA:  Altered mental status.  Syncope. EXAM: PORTABLE CHEST 1 VIEW.  Patient markedly rotated. COMPARISON:  Chest x-ray 02/27/2020, CT chest 03/01/2020 FINDINGS: Limited evaluation of the heart and mediastinal contours due to patient rotation. No focal consolidation. No pulmonary edema. No pleural effusion. No pneumothorax. No acute osseous abnormality. IMPRESSION: 1. Marked patient rotation with limited evaluation of the cardiomediastinal silhouette. Recommend repeat PA and lateral view of the chest. 2. No definite focal consolidation.  Electronically Signed   By: Iven Finn M.D.   On: 02/09/2021 23:09    EKG: Personally reviewed.  Rhythm is normal sinus rhythm with heart rate of 78 bpm.  No dynamic ST segment changes appreciated.  Assessment/Plan Principal Problem:   Acute metabolic encephalopathy  Patient presenting with recent development of generalized weakness lethargy and confusion discerning for acute metabolic encephalopathy Work-up so far has been unremarkable with unremarkable urinalysis and what seems to be an unremarkable chest x-ray.  Planning on obtaining repeat chest x-ray to definitively rule out an infectious process in the chest. There was initial concern for possible stroke involving the right dorsal pons on MRI brain performed by the emergency department provider.  MRI images have been reviewed by neurology who feels that this finding is likely artifactual and not secondary to an acute stroke. Neurology has evaluated the patient, their input is appreciated.  They recommend obtaining an EEG as well as completing the patient's encephalopathy work-up. Additionally, TSH and urinalysis are unremarkable.  COVID-19 testing is negative. Additionally obtaining ammonia level, RPR, and ESR which are pending. Gentle intravenous hydration PT evaluation due to additional complaints of generalized weakness  Active Problems:   Essential hypertension  Continue home regimen of antihypertensive therapy Will order additional as needed intravenous antihypertensives for markedly elevated blood pressure    GERD without esophagitis  Continue home regimen of Protonix    Chronic kidney disease, stage 3a (HCC)  Strict intake and output monitoring Creatinine near baseline Minimizing nephrotoxic agents as much as possible Serial chemistries to monitor renal function and electrolytes    Chronic diastolic CHF (congestive heart failure) (HCC)  No evidence of cardiogenic volume overload   Code Status:  Full  code Family Communication: Attempts to contact the son via the listed phone number have been unsuccessful.  Status is: Observation  The patient remains OBS appropriate and will d/c before 2 midnights.  Dispo: The patient is from: Home              Anticipated d/c is to: Home              Patient currently is not medically stable to d/c.   Difficult to place patient No        Vernelle Emerald MD [facility] Pager [phone]  If 7PM-7AM, please contact night-coverage [email or web address] Use universal [place] password for that web site. If you do not have the password, please call the hospital operator.  02/10/2021, 7:31 AM

## 2021-02-10 NOTE — Progress Notes (Signed)
EEG completed, results pending. 

## 2021-02-10 NOTE — Progress Notes (Signed)
[name]  SHF:[id number] DOB: 1925/03/17 DOA: 02/09/2021 PCP: [name], MD    Brief Narrative:  87 year old with a history of HTN, CKD stage IIIa, GERD, HLD, and diastolic CHF who presented to the ER with complaints of severe generalized weakness, lethargy, and confusion.  MRI revealed a possible 1 cm right dorsal pons CVA, but expert Neurology consultation suggests this is simply artifact.  Significant Events:  8/28 admit via ED  Consultants:  Neurology  Code Status: FULL CODE  Antimicrobials:  None  DVT prophylaxis: Lovenox  Subjective: Afebrile.  Blood pressure elevated at 180 systolic but vitals otherwise stable.  Assessment & Plan:  Toxic metabolic encephalopathy -generalized weakness/lethargy No evidence of an acute infection -EEG pending - mental status appears to be improving   B12 deficiency B12 suboptimal at 192 - supplement aggressively  HTN Continue usual home blood pressure medications  GERD Continue usual Protonix  CKD stage III AAA Creatinine stable  Chronic diastolic CHF   Family Communication:  Status is: Observation  The patient remains OBS appropriate and will d/c before 2 midnights.  Dispo: The patient is from: Home              Anticipated d/c is to:  Unclear              Patient currently is not medically stable to d/c.   Difficult to place patient No         Objective: Blood pressure (!) 181/79, pulse 71, temperature 98.8 F (37.1 C), temperature source Oral, resp. rate 15, SpO2 99 %.  Intake/Output Summary (Last 24 hours) at 02/10/2021 0949 Last data filed at 02/10/2021 0811 Gross per 24 hour  Intake 1000 ml  Output 900 ml  Net 100 ml   There were no vitals filed for this visit.  Examination: Patient was examined and interviewed by one of my partners earlier today.  CBC: Recent Labs  Lab 02/09/21 2000 02/10/21 0833  WBC 6.2 7.2  NEUTROABS 4.5 5.7  HGB 11.0* 11.2*  HCT 36.2 36.5  MCV 71.7* 72.1*   PLT 211 261   Basic Metabolic Panel: Recent Labs  Lab 02/09/21 2000 02/10/21 0833  NA 137 138  K 3.1* 3.8  CL 107 108  CO2 21* 23  GLUCOSE 145* 115*  BUN 17 11  CREATININE 0.94 0.81  CALCIUM 8.9 9.0  MG  --  2.0   GFR: CrCl cannot be calculated (Unknown ideal weight.).  Liver Function Tests: Recent Labs  Lab 02/09/21 2000 02/10/21 0833  AST 17 30  ALT 10 12  ALKPHOS 62 66  BILITOT 0.6 0.4  PROT 6.3* 6.6  ALBUMIN 3.2* 3.1*     Recent Results (from the past 240 hour(s))  SARS CORONAVIRUS 2 (TAT 6-24 HRS) Nasopharyngeal Nasopharyngeal Swab     Status: None   Collection Time: 02/10/21  3:09 AM   Specimen: Nasopharyngeal Swab  Result Value Ref Range Status   SARS Coronavirus 2 NEGATIVE NEGATIVE Final    Comment: (NOTE) SARS-CoV-2 target nucleic acids are NOT DETECTED.  The SARS-CoV-2 RNA is generally detectable in upper and lower respiratory specimens during the acute phase of infection. Negative results do not preclude SARS-CoV-2 infection, do not rule out co-infections with other pathogens, and should not be used as the sole basis for treatment or other patient management decisions. Negative results must be combined with clinical observations, patient history, and epidemiological information. The expected result is Negative.  Fact Sheet for Patients: HairSlick.no  Fact Sheet  for Healthcare Providers: quierodirigir.com  This test is not yet approved or cleared by the [place] and  has been authorized for detection and/or diagnosis of SARS-CoV-2 by FDA under an Emergency Use Authorization (EUA). This EUA will remain  in effect (meaning this test can be used) for the duration of the COVID-19 declaration under Se ction 564(b)(1) of the Act, 21 U.S.C. section 360bbb-3(b)(1), unless the authorization is terminated or revoked sooner.  Performed at [facility], [address],  [place], [place] [address]      Scheduled Meds:  amLODipine  10 mg Oral Daily   aspirin  81 mg Oral Daily   atorvastatin  80 mg Oral Daily   enoxaparin (LOVENOX) injection  40 mg Subcutaneous Daily   hydrALAZINE  50 mg Oral BID   losartan  50 mg Oral Daily   pantoprazole  20 mg Oral Daily   Continuous Infusions:  lactated ringers 75 mL/hr at 02/10/21 0836     LOS: 0 days   Lonia Blood, MD [facility] Office  [phone] Pager - Text Page per Loretha Stapler  If 7PM-7AM, please contact night-coverage per Amion 02/10/2021, 9:49 AM

## 2021-02-10 NOTE — Consult Note (Addendum)
NEURO HOSPITALIST CONSULT NOTE   Requestig physician: Dr. Cyd Silence  Reason for Consult: AMS  History obtained from:   Patient and Chart     HPI:                                                                                                                                          [name] is an 87 y.o. female with a PMHx of hypercholesterolemia and HTN, who presented to the ED on Saturday evening with complaints of near syncope and diffuse weakness. On initial Triage RN evaluation, the patient was oriented to self only and was "generally weak all over". The patient endorsed feeling sick and stated that  "something feels wrong". She had no headache, neck pain, abdominal pain, or vomiting, but did endorse nausea. She felt that she had been smelling something burning today. She was drastically different from her baseline per family, was perseverating on the same topics including repeating several times that she felt sick. Family felt that her speech was off.   At baseline she is independent, able to write her own checks and get around on her own.   Past Medical History:  Diagnosis Date   Diverticular disease    GERD (gastroesophageal reflux disease)    Hiatal hernia    High cholesterol    Hypertension     Past Surgical History:  Procedure Laterality Date   ABDOMINAL HYSTERECTOMY     HYSTEROTOMY     LIVER BIOPSY      Family History  Family history unknown: Yes             Social History:  reports that she has never smoked. She has never used smokeless tobacco. She reports that she does not drink alcohol and does not use drugs.  Allergies  Allergen Reactions   Iodine Hives    MEDICATIONS:                                                                                                                      No current facility-administered medications on file prior to encounter.   Current Outpatient Medications on File Prior to Encounter   Medication Sig Dispense Refill   amLODipine (NORVASC) 10 MG tablet Take 10 mg by mouth daily.  aspirin 81 MG chewable tablet Take one (1) tablet by mouth daily     atorvastatin (LIPITOR) 80 MG tablet Take 80 mg by mouth daily.     calcium-vitamin D (OSCAL WITH D) 500-200 MG-UNIT per tablet Take 1 tablet by mouth daily.     cholecalciferol (VITAMIN D) 1000 UNITS tablet Take 2,000 Units by mouth daily.     diclofenac Sodium (VOLTAREN) 1 % GEL See admin instructions.     doxycycline (VIBRA-TABS) 100 MG tablet Take 1 tablet po 2x a day x 5 days     Ferrous Sulfate (IRON) 325 (65 Fe) MG TABS Take 1 tablet by mouth daily.     hydrALAZINE (APRESOLINE) 50 MG tablet Take 1 tablet (50 mg total) by mouth 2 (two) times daily. 60 tablet 2   losartan (COZAAR) 25 MG tablet Take 50 mg by mouth daily.     meclizine (ANTIVERT) 25 MG tablet one po q 6 hrs prn vertigo     Multiple Vitamin (MULITIVITAMIN WITH MINERALS) TABS Take 1 tablet by mouth daily.     Omega-3 Fatty Acids (FISH OIL) 1000 MG CAPS Take 1,000 mg by mouth daily.      pantoprazole (PROTONIX) 20 MG tablet Take 1 tablet (20 mg total) by mouth daily. 30 tablet 2   polyvinyl alcohol (LIQUIFILM TEARS) 1.4 % ophthalmic solution Place 1 drop into both eyes as needed for dry eyes.       ROS:                                                                                                                                       No incontinence. No diarrhea or constipation. Other ROS as per HPI.    Blood pressure (!) 148/111, pulse 93, temperature (!) 97.4 F (36.3 C), resp. rate (!) 22, SpO2 100 %.   General Examination:                                                                                                       Physical Exam  HEENT-  [place]/AT    Lungs- Respirations unlabored Extremities- Warm and well perfused.   Neurological Examination Mental Status: Awake and alert. Pleasant but with mildly decreased cooperation. Speech fluent with  intact comprehension for all commands. Oriented to the date, year, month, city and state.  Cranial Nerves: II: Temporal visual fields grossly intact with no extinction to DSS. PERRL.  III,IV, VI: EOMI. No nystagmus. No ptosis.  V,VII: Refuses to smile.  Face symmetric while speaking. Temp sensation equal bilaterally.  VIII: hearing intact to voice IX,X: No hypophonia XI: Symmetric XII: Midline tongue extension Motor: 5/5 x 4 Sensory: Temp and FT sensation intact x 4. No extinction to DSS.  Deep Tendon Reflexes: 1-2+ and symmetric throughout Plantars: Equivocal bilaterally  Cerebellar: No ataxia with FNF bilaterally  Gait: Deferred   Lab Results: Basic Metabolic Panel: Recent Labs  Lab 02/09/21 2000  NA 137  K 3.1*  CL 107  CO2 21*  GLUCOSE 145*  BUN 17  CREATININE 0.94  CALCIUM 8.9    CBC: Recent Labs  Lab 02/09/21 2000  WBC 6.2  NEUTROABS 4.5  HGB 11.0*  HCT 36.2  MCV 71.7*  PLT 211    Cardiac Enzymes: No results for input(s): CKTOTAL, CKMB, CKMBINDEX, TROPONINI in the last 168 hours.  Lipid Panel: No results for input(s): CHOL, TRIG, HDL, CHOLHDL, VLDL, LDLCALC in the last 168 hours.  Imaging: CT HEAD WO CONTRAST (5MM)  Result Date: 02/09/2021 CLINICAL DATA:  Delirium. EXAM: CT HEAD WITHOUT CONTRAST TECHNIQUE: Contiguous axial images were obtained from the base of the skull through the vertex without intravenous contrast. COMPARISON:  February 27, 2020 FINDINGS: Brain: There is mild cerebral atrophy with widening of the extra-axial spaces and ventricular dilatation. There are areas of decreased attenuation within the white matter tracts of the supratentorial brain, consistent with microvascular disease changes. Vascular: No hyperdense vessel or unexpected calcification. Skull: Normal. Negative for fracture or focal lesion. Sinuses/Orbits: No acute finding. Other: None. IMPRESSION: 1. Generalized cerebral atrophy. 2. No acute intracranial abnormality.  Electronically Signed   By: Virgina Norfolk M.D.   On: 02/09/2021 20:25   MR BRAIN WO CONTRAST  Result Date: 02/10/2021 CLINICAL DATA:  Initial evaluation for altered mental status. EXAM: MRI HEAD WITHOUT CONTRAST TECHNIQUE: Multiplanar, multiecho pulse sequences of the brain and surrounding structures were obtained without intravenous contrast. COMPARISON:  CT from 02/09/2021. FINDINGS: Brain: Examination degraded by motion artifact. Age-related cerebral atrophy. Mild T2/FLAIR hyperintensity involving the periventricular white matter most likely reflects chronic microvascular ischemic disease, overall mild in felt to be within normal limits for age. There is question of a 1 cm focus of diffusion abnormality involving the right dorsal pons (series 2, image 18), not entirely certain as there is motion artifact through this region. No associated susceptibility artifact or mass effect. No other diffusion abnormality to suggest acute or subacute ischemia. Gray-white matter differentiation otherwise maintained. No other areas of chronic cortical infarction. No other acute or chronic intracranial hemorrhage. No mass lesion or midline shift. No hydrocephalus or extra-axial fluid collection. Empty sella noted. Vascular: Major intracranial vascular flow voids are grossly maintained at the skull base. Skull and upper cervical spine: Craniocervical junction within normal limits. Bone marrow signal intensity normal. No scalp soft tissue abnormality. Sinuses/Orbits: Prior bilateral ocular lens replacement. Visualized paranasal sinuses and mastoid air cells are clear. Other: None. IMPRESSION: 1. Motion degraded exam. 2. Question 1 cm focus of diffusion abnormality involving the right dorsal pons, not entirely certain as there is motion artifact through this region. Correlation with history and symptomatology for possible acute ischemia at this location recommended. 3. No other acute intracranial abnormality. Electronically  Signed   By: Jeannine Boga M.D.   On: 02/10/2021 01:00   DG Chest Portable 1 View  Result Date: 02/09/2021 CLINICAL DATA:  Altered mental status.  Syncope. EXAM: PORTABLE CHEST 1 VIEW.  Patient markedly rotated. COMPARISON:  Chest x-ray 02/27/2020, CT chest 03/01/2020 FINDINGS: Limited  evaluation of the heart and mediastinal contours due to patient rotation. No focal consolidation. No pulmonary edema. No pleural effusion. No pneumothorax. No acute osseous abnormality. IMPRESSION: 1. Marked patient rotation with limited evaluation of the cardiomediastinal silhouette. Recommend repeat PA and lateral view of the chest. 2. No definite focal consolidation. Electronically Signed   By: Iven Finn M.D.   On: 02/09/2021 23:09     Assessment: 87 year old female presenting with AMS.  1. MRI brain: On review by Neurology, the study is unremarkable except for diffuse atrophy. Official Radiology report describes an equivocal 1 cm focus of signal abnormality in the right dorsal pons; this finding, based on my review of the films, appears most likely to be artifactual.  2. Exam reveals a mildly confused elderly female in NAD, with no focal motor or sensory deficits. 3. TSH normal. AST and ALT normal.   4. DDx for the patient's presentation includes toxic/metabolic/infectious encephalopathy as well as subclinical seizure. Again, the MRI does not reveal any convincing evidence for an acute stroke.   Recommendations: 1. EEG in AM (ordered) 2. Toxic/metabolic/infectious work up.  3. Empiric high-dose thiamine 500 mg IV TID x 3 days.  4. Vitamin B12 level, RPR, ESR and ammonia level.  5. Orthostatics 6. IVF  Electronically signed: Dr. Kerney Elbe 02/10/2021, 3:13 AM

## 2021-02-11 DIAGNOSIS — G9341 Metabolic encephalopathy: Secondary | ICD-10-CM | POA: Diagnosis not present

## 2021-02-11 DIAGNOSIS — I10 Essential (primary) hypertension: Secondary | ICD-10-CM | POA: Diagnosis not present

## 2021-02-11 MED ORDER — VITAMIN B-12 1000 MCG PO TABS: 1000.0000 ug | ORAL_TABLET | Freq: Every day | ORAL | 0 refills | Status: AC

## 2021-02-11 NOTE — Care Management Obs Status (Signed)
MEDICARE OBSERVATION STATUS NOTIFICATION   Patient Details  Name: [name] MRN: [id number] Date of Birth: 16-Nov-1924   Medicare Observation Status Notification Given:  Yes    Gala Lewandowsky, RN 02/11/2021, 12:10 PM

## 2021-02-11 NOTE — Progress Notes (Signed)
Dominga [place] to be D/C'd Home per MD order.  Discussed with the patient and all questions fully answered.  VSS, Skin clean, dry and intact without evidence of skin break down, no evidence of skin tears noted. IV catheter discontinued intact. Site without signs and symptoms of complications. Dressing and pressure applied.  An After Visit Summary was printed and given to the patient. Patient received prescription.  D/c education completed with patient/family including follow up instructions, medication list, d/c activities limitations if indicated, with other d/c instructions as indicated by MD - patient able to verbalize understanding, all questions fully answered.   Patient instructed to return to ED, call 911, or call MD for any changes in condition.   Patient escorted via WC, and D/C home via private auto.  Selena Batten Tiffanyann Deroo 02/11/2021 4:28 PM

## 2021-02-11 NOTE — Plan of Care (Signed)
  Problem: Clinical Measurements: Goal: Ability to maintain clinical measurements within normal limits will improve Outcome: Progressing   Problem: Clinical Measurements: Goal: Will remain free from infection Outcome: Progressing   

## 2021-02-11 NOTE — Evaluation (Signed)
Physical Therapy Evaluation Patient Details Name: [name] MRN: [id number] DOB: [birth year] Today's Date: 02/11/2021   History of Present Illness  87 yo female presents to [facility] on 8/27 with near syncope, weakness. MRI brain negative for acute CVA, workup for encephalopathy. PMH includes GERD, diverticular disease, HTN, FTT, HTN, HLD.   Clinical Impression  Pt presents with generalized weakness, impaired dynamic standing balance, and impaired activity tolerance vs baseline. Pt to benefit from acute PT to address deficits. Pt ambulated hallway distance with close guard and without AD, pt with mild unsteadiness and reaching for environmental steadying as needed. Per pt and son, pt weaker and more unsteady vs baseline. PT to progress mobility as tolerated, and will continue to follow acutely.      Follow Up Recommendations Home health PT;Supervision/Assistance - 24 hour (has 24/7 assist from family)    Equipment Recommendations  None recommended by PT    Recommendations for Other Services       Precautions / Restrictions Precautions Precautions: Fall Precaution Comments: watch HR -resting around 100 bpm Restrictions Weight Bearing Restrictions: No      Mobility  Bed Mobility Overal bed mobility: Needs Assistance Bed Mobility: Supine to Sit;Sit to Supine     Supine to sit: Supervision;HOB elevated Sit to supine: Supervision;HOB elevated   General bed mobility comments: for safety, increased time.    Transfers Overall transfer level: Needs assistance Equipment used: None Transfers: Sit to/from Stand Sit to Stand: Supervision         General transfer comment: for safety, slow to rise and steady, with uncontrolled descent on last stand>sit. STS x2 from EOB.  Ambulation/Gait Ambulation/Gait assistance: Min guard Gait Distance (Feet): 100 Feet Assistive device: None Gait Pattern/deviations: Step-through pattern;Decreased stride length;Drifts right/left Gait  velocity: decr   General Gait Details: close guard for safety, slight weaving of gait especially with head turns. Pt intermittently reaching for environment to steady. HR 100-114 bpm  Stairs Stairs:  (high march in place with hallway railing, x10 bilat, close guard for safety but no evidence of LOB)          Wheelchair Mobility    Modified Rankin (Stroke Patients Only)       Balance Overall balance assessment: Needs assistance Sitting-balance support: No upper extremity supported;Feet supported Sitting balance-Leahy Scale: Good     Standing balance support: No upper extremity supported;During functional activity Standing balance-Leahy Scale: Fair Standing balance comment: ambulatory without AD at baseline                             Pertinent Vitals/Pain Pain Assessment: No/denies pain    Home Living Family/patient expects to be discharged to:: Private residence Living Arrangements: Other relatives Available Help at Discharge: Family;Available PRN/intermittently Type of Home: House Home Access: Stairs to enter   [employer] of Steps: pt's son states 10-12 outside Home Layout: Two level Home Equipment: None      Prior Function Level of Independence: Independent         Comments: pt states she does not use AD, no history of falls in the last year, manages medications and check book.     Hand Dominance   Dominant Hand: Right    Extremity/Trunk Assessment   Upper Extremity Assessment Upper Extremity Assessment: Defer to OT evaluation    Lower Extremity Assessment Lower Extremity Assessment: Generalized weakness    Cervical / Trunk Assessment Cervical / Trunk Assessment: Normal  Communication  Communication: No difficulties  Cognition Arousal/Alertness: Awake/alert Behavior During Therapy: WFL for tasks assessed/performed Overall Cognitive Status: Impaired/Different from baseline Area of Impairment: Problem  solving;Safety/judgement                         Safety/Judgement: Decreased awareness of safety   Problem Solving: Requires verbal cues General Comments: Pt oriented to self and situation, does lose track of location of room when returning from hallway and initially states "this isn't my bed" even though son is in the room. Follows commands well, sharp sense of humor.      General Comments General comments (skin integrity, edema, etc.): HR 100-114 bpm, no orthostasis when assessed supine vs ambulation    Exercises     Assessment/Plan    PT Assessment Patient needs continued PT services  PT Problem List Decreased strength;Decreased mobility;Decreased activity tolerance;Decreased balance;Decreased knowledge of precautions;Cardiopulmonary status limiting activity       PT Treatment Interventions Therapeutic activities;Gait training;Therapeutic exercise;Patient/family education;Balance training;Stair training;Functional mobility training;Neuromuscular re-education    PT Goals (Current goals can be found in the Care Plan section)  Acute Rehab PT Goals PT Goal Formulation: With patient Time For Goal Achievement: 02/25/21 Potential to Achieve Goals: Good    Frequency Min 3X/week   Barriers to discharge        Co-evaluation               AM-PAC PT "6 Clicks" Mobility  Outcome Measure Help needed turning from your back to your side while in a flat bed without using bedrails?: None Help needed moving from lying on your back to sitting on the side of a flat bed without using bedrails?: None Help needed moving to and from a bed to a chair (including a wheelchair)?: A Little Help needed standing up from a chair using your arms (e.g., wheelchair or bedside chair)?: A Little Help needed to walk in hospital room?: A Little Help needed climbing 3-5 steps with a railing? : A Little 6 Click Score: 20    End of Session   Activity Tolerance: Patient tolerated treatment  well Patient left: in bed;with call bell/phone within reach;with bed alarm set;with family/visitor present Nurse Communication: Mobility status;Other (comment) (family requesting nutrition consult) PT Visit Diagnosis: Other abnormalities of gait and mobility (R26.89);Muscle weakness (generalized) (M62.81)    Time: 4540-9811 PT Time Calculation (min) (ACUTE ONLY): 23 min   Charges:   PT Evaluation $PT Eval Low Complexity: 1 Low PT Treatments $Gait Training: 8-22 mins        Marye Round, PT DPT Acute Rehabilitation Services Pager [phone]  Office [phone]   Shondell Fabel E Christain [place] 02/11/2021, 10:21 AM

## 2021-02-11 NOTE — Progress Notes (Signed)
Nutrition Brief Note  RD consulted for assessment of nutritional requirements/ status.  Wt Readings from Last 15 Encounters:  02/11/21 60.2 kg  04/11/20 61.6 kg  03/01/20 59.7 kg  12/27/19 59 kg  07/06/18 59.4 kg  05/04/14 66.7 kg  11/14/13 68 kg  10/01/12 65 kg  10/29/11 98.47 kg   87 year old female with past medical history of hypertension, chronic kidney disease stage IIIa, gastroesophageal reflux disease, hyperlipidemia, diastolic congestive heart failure (Echo 02/2020 EF 60-65% with G1DD) presenting to [facility] emergency department due to complaints of generalized weakness.  Pt admitted with acute metabolic encephalopathy.   Reviewed I/O's: -1.6 L x 24 hours and -600 ml since admission  UOP: 1.6 L x 24 hours  Received page from RN; pt family requesting RD consult prior to discharge.   Spoke with pt and family at bedside. Family expressed concern that pt is losing weight and not eating adequately. Pt still prepares her own meals, but lives with her grandson, who encourages her to eat. Pt typically consumes 2 meals per day, which consist of breakfast of cereal and whole milk and supper of rice and vegetables. Pt will occasionally eat meat (chicken or fish) 2-3 times per week. She also snacks on potato chips and consumes mostly water.   Per pt, she avoids certain foods due to acid reflux.   RD discussed ways for pt to include more calories and protein to increase nutrient intake. Encouraged small frequent meals, ways to increase calories in recipes (adding milk, milk powder, butter, and peanut butter). Gave pt freedom to eat whatever food is appealing to her. Discussed appropriate nutritional supplements. Teachback method used.   Medications reviewed and include vitamin B-12.   Labs reviewed.   Current diet order is Heart Healthy, patient is consuming approximately n/a% of meals at this time. Labs and medications reviewed.   No nutrition interventions warranted at this  time. If nutrition issues arise, please consult RD.   Levada Schilling, RD, LDN, CDCES Registered Dietitian II Certified Diabetes Care and Education Specialist Please refer to [facility] for RD and/or RD on-call/weekend/after hours pager

## 2021-02-11 NOTE — Discharge Summary (Signed)
Physician Discharge Summary  [name] ZOX:[id number] DOB: [birth year]  PCP: Madison Corn, MD  Admitted from: Home  Discharged to: Home  Admit date: 02/09/2021 Discharge date: 02/11/2021  Recommendations for Outpatient Follow-up:    Follow-up Information     Care, [facility] Follow up.   Specialty: Home Health Services Why: Registered Nurse, Physical Therapy & Occupational Therapy-office to call with visit times. Contact information: [address] [place] [address] [phone]         Madison Corn, MD. Schedule an appointment as soon as possible for a visit in 1 week(s).   Specialty: Internal Medicine Why: To be seen with repeat labs (CBC & BMP). Contact information: [address] [place] [address] [phone]         Madison Fair, MD .   Specialty: Cardiology Contact information: [address] [place] [address] [phone]                  Home Health:  Home Health Orders (From admission, onward)     Start     Ordered   02/11/21 1232  Home Health  At discharge       Question Answer Comment  To provide the following care/treatments PT   To provide the following care/treatments OT   To provide the following care/treatments RN      02/11/21 1234             Equipment/Devices: None.    Discharge Condition: Improved and stable.   Code Status: Full Code Diet recommendation:  Discharge Diet Orders (From admission, onward)     Start     Ordered   02/11/21 0000  Diet - low sodium heart healthy        02/11/21 1230             Discharge Diagnoses:  Principal Problem:   Acute metabolic encephalopathy Active Problems:   Essential hypertension   GERD without esophagitis   Chronic kidney disease, stage 3a (HCC)   Chronic diastolic CHF (congestive heart failure) (HCC)   Toxic metabolic encephalopathy   Brief Summary: 87 year old with a history of HTN, CKD stage IIIa, GERD,  HLD, and diastolic CHF who presented to the ER with complaints of severe generalized weakness, lethargy, and confusion.  MRI revealed a possible 1 cm right dorsal pons CVA, but expert Neurology consultation suggests this is simply artifact.  Assessment & Plan:   Toxic metabolic encephalopathy -generalized weakness/lethargy No evidence of an acute infection -EEG is within normal limits.  No seizures or epileptiform discharges were seen throughout the recording. Mental status improved and may be back to her baseline.  Was alert and quite coherent this morning and answered all questions appropriately.  Urine microscopy with insignificant growth. TSH normal.  RPR nonreactive.  COVID-19 PCR negative.   B12 deficiency B12 suboptimal at 192.  Treated with subcutaneous B12 shots while hospitalized.  Discharging on oral B12 supplements.  Recommend repeating B12 levels in a couple of months and if still suboptimal, may need further evaluation and parenteral B12.   HTN Continue usual home blood pressure medications.  Controlled.   GERD Continue usual Protonix   CKD stage III a Creatinine stable   Chronic diastolic CHF Compensated  Microcytic anemia May be related to iron deficiency versus anemia of chronic disease.  Stable.  Continue home iron supplements and outpatient follow-up.  Hypokalemia Replaced.  Magnesium normal.  Consultations: Neurology  Procedures: None   Discharge Instructions  Discharge Instructions     Call MD for:   Complete by: As directed    Recurrent confusion or mental status change   Call MD for:  difficulty breathing, headache or visual disturbances   Complete by: As directed    Call MD for:  extreme fatigue   Complete by: As directed    Call MD for:  persistant dizziness or light-headedness   Complete by: As directed    Call MD for:  persistant nausea and vomiting   Complete by: As directed    Call MD for:  severe uncontrolled pain   Complete by: As  directed    Call MD for:  temperature >100.4   Complete by: As directed    Diet - low sodium heart healthy   Complete by: As directed    Increase activity slowly   Complete by: As directed         Medication List     TAKE these medications    amLODipine 10 MG tablet Commonly known as: NORVASC Take 10 mg by mouth daily.   aspirin 81 MG chewable tablet Take one (1) tablet by mouth daily   atorvastatin 80 MG tablet Commonly known as: LIPITOR Take 80 mg by mouth daily.   calcium-vitamin D 500-200 MG-UNIT tablet Commonly known as: OSCAL WITH D Take 1 tablet by mouth daily.   Fish Oil 1000 MG Caps Take 1,000 mg by mouth daily.   hydrALAZINE 50 MG tablet Commonly known as: APRESOLINE Take 1 tablet (50 mg total) by mouth 2 (two) times daily.   Iron 325 (65 Fe) MG Tabs Take 325 mg by mouth daily.   losartan 25 MG tablet Commonly known as: COZAAR Take 50 mg by mouth daily.   multivitamin with minerals Tabs tablet Take 1 tablet by mouth daily.   pantoprazole 20 MG tablet Commonly known as: PROTONIX Take 1 tablet (20 mg total) by mouth daily.   polyvinyl alcohol 1.4 % ophthalmic solution Commonly known as: LIQUIFILM TEARS Place 1 drop into both eyes daily as needed for dry eyes. Blink   vitamin B-12 1000 MCG tablet Commonly known as: CYANOCOBALAMIN Take 1 tablet (1,000 mcg total) by mouth daily.   Vitamin D 50 MCG (2000 UT) tablet Take 2,000 Units by mouth daily.       Allergies  Allergen Reactions   Iodine Hives      Procedures/Studies: DG Chest 1 View  Result Date: 02/10/2021 CLINICAL DATA:  87 year old female with syncope. Rotated portable view last night. EXAM: CHEST  1 VIEW COMPARISON:  02/09/2021 and earlier. FINDINGS: Portable AP semi upright view at 0756 hours. Similar patient rotation to the right. Tortuous thoracic aorta contour appears stable compared to a CT last year. Cardiac size is at the upper limits of normal. Visualized tracheal air  column is within normal limits. Continued somewhat low lung volumes. Skin fold artifact projects over the left lung. Allowing for portable technique the lungs are clear. No pneumothorax or pleural effusion. Paucity of bowel gas. No acute osseous abnormality identified. IMPRESSION: Low lung volumes.  No acute cardiopulmonary abnormality. Electronically Signed   By: Odessa Fleming M.D.   On: 02/10/2021 08:03   CT HEAD WO CONTRAST ( )  Result Date: 02/09/2021 CLINICAL DATA:  Delirium. EXAM: CT HEAD WITHOUT CONTRAST TECHNIQUE: Contiguous axial images were obtained from the base of the skull through the vertex without intravenous contrast. COMPARISON:  February 27, 2020 FINDINGS: Brain: There is mild cerebral atrophy with widening of the extra-axial spaces  and ventricular dilatation. There are areas of decreased attenuation within the white matter tracts of the supratentorial brain, consistent with microvascular disease changes. Vascular: No hyperdense vessel or unexpected calcification. Skull: Normal. Negative for fracture or focal lesion. Sinuses/Orbits: No acute finding. Other: None. IMPRESSION: 1. Generalized cerebral atrophy. 2. No acute intracranial abnormality. Electronically Signed   By: Aram Candelahaddeus  Houston M.D.   On: 02/09/2021 20:25   MR BRAIN WO CONTRAST  Result Date: 02/10/2021 CLINICAL DATA:  Initial evaluation for altered mental status. EXAM: MRI HEAD WITHOUT CONTRAST TECHNIQUE: Multiplanar, multiecho pulse sequences of the brain and surrounding structures were obtained without intravenous contrast. COMPARISON:  CT from 02/09/2021. FINDINGS: Brain: Examination degraded by motion artifact. Age-related cerebral atrophy. Mild T2/FLAIR hyperintensity involving the periventricular white matter most likely reflects chronic microvascular ischemic disease, overall mild in felt to be within normal limits for age. There is question of a 1 cm focus of diffusion abnormality involving the right dorsal pons (series 2,  image 18), not entirely certain as there is motion artifact through this region. No associated susceptibility artifact or mass effect. No other diffusion abnormality to suggest acute or subacute ischemia. Gray-white matter differentiation otherwise maintained. No other areas of chronic cortical infarction. No other acute or chronic intracranial hemorrhage. No mass lesion or midline shift. No hydrocephalus or extra-axial fluid collection. Empty sella noted. Vascular: Major intracranial vascular flow voids are grossly maintained at the skull base. Skull and upper cervical spine: Craniocervical junction within normal limits. Bone marrow signal intensity normal. No scalp soft tissue abnormality. Sinuses/Orbits: Prior bilateral ocular lens replacement. Visualized paranasal sinuses and mastoid air cells are clear. Other: None. IMPRESSION: 1. Motion degraded exam. 2. Question 1 cm focus of diffusion abnormality involving the right dorsal pons, not entirely certain as there is motion artifact through this region. Correlation with history and symptomatology for possible acute ischemia at this location recommended. 3. No other acute intracranial abnormality. Electronically Signed   By: Rise MuBenjamin  McClintock M.D.   On: 02/10/2021 01:00   DG Chest Portable 1 View  Result Date: 02/09/2021 CLINICAL DATA:  Altered mental status.  Syncope. EXAM: PORTABLE CHEST 1 VIEW.  Patient markedly rotated. COMPARISON:  Chest x-ray 02/27/2020, CT chest 03/01/2020 FINDINGS: Limited evaluation of the heart and mediastinal contours due to patient rotation. No focal consolidation. No pulmonary edema. No pleural effusion. No pneumothorax. No acute osseous abnormality. IMPRESSION: 1. Marked patient rotation with limited evaluation of the cardiomediastinal silhouette. Recommend repeat PA and lateral view of the chest. 2. No definite focal consolidation. Electronically Signed   By: Tish FredericksonMorgane  Naveau M.D.   On: 02/09/2021 23:09   EEG adult  Result  Date: 02/10/2021 Charlsie QuestYadav, Priyanka O, MD     02/10/2021  1:29 PM Patient Name: Dominga FerryDorothy Horton Jetter MRN: [id number] Epilepsy Attending: Charlsie QuestPriyanka O Yadav Referring Physician/Provider: Dr Caryl PinaEric Lindzen Date: 02/10/2021 Duration: 24.05 mins Patient history:  87 year old female presenting with AMS. EEG to evaluate for seizure Level of alertness: Awake, asleep AEDs during EEG study: None Technical aspects: This EEG study was done with scalp electrodes positioned according to the 10-20 International system of electrode placement. Electrical activity was acquired at a sampling rate of 500Hz  and reviewed with a high frequency filter of 70Hz  and a low frequency filter of 1Hz . EEG data were recorded continuously and digitally stored. Description: The posterior dominant rhythm consists of 9-10 Hz activity of moderate voltage (25-35 uV) seen predominantly in posterior head regions, symmetric and reactive to eye opening and eye closing.  Sleep was characterized by vertex waves, sleep spindles (12 to 14 Hz), maximal frontocentral region.  Physiologic photic driving was not seen during photic stimulation.  Hyperventilation was not performed.   IMPRESSION: This study is within normal limits. No seizures or epileptiform discharges were seen throughout the recording. Priyanka Annabelle Harman      Subjective: Patient denies complaints.  She is alert and oriented sitting up in bed reading this morning's newspaper.  She states that she lives with her great grandson at home.  As per RN, no acute issues noted.  Patient also states that she wants to go home.  I was unable to reach patient's son via phone.  Left him a voicemail message.  According to his communication with [facility] team, he wanted home health RN along with PT and OT for nutritional support and education. Discharge Exam:  Vitals:   02/11/21 0350 02/11/21 0819 02/11/21 0930 02/11/21 1216  BP: 128/65 123/73 137/78 (!) 127/59  Pulse: 68 76 83 76  Resp: 15 20 19 15   Temp: 98.6 F (37  C) 97.9 F (36.6 C)  98 F (36.7 C)  TempSrc: Oral Oral  Oral  SpO2: 98% 98% 93% 93%  Weight: 60.2 kg     Height:        General: Pt lying comfortably in bed & appears in no obvious distress.  Elderly female, small built and frail, lying comfortably propped up in bed without distress. Cardiovascular: S1 & S2 heard, RRR, S1/S2 +. No murmurs, rubs, gallops or clicks. No JVD or pedal edema.  Telemetry personally reviewed: Sinus rhythm. Respiratory: Clear to auscultation without wheezing, rhonchi or crackles. No increased work of breathing. Abdominal:  Non distended, non tender & soft. No organomegaly or masses appreciated. Normal bowel sounds heard. CNS: Alert and oriented. No focal deficits. Extremities: no edema, no cyanosis    The results of significant diagnostics from this hospitalization (including imaging, microbiology, ancillary and laboratory) are listed below for reference.     Microbiology: Recent Results (from the past 240 hour(s))  Urine Culture     Status: Abnormal (Preliminary result)   Collection Time: 02/09/21 10:29 PM   Specimen: Urine, Clean Catch  Result Value Ref Range Status   Specimen Description URINE, CLEAN CATCH  Final   Special Requests   Final    NONE Performed at [facility], [address], [place], [place]    Culture 10,000 COLONIES/mL ESCHERICHIA COLI (A)  Final   Report Status PENDING  Incomplete  SARS CORONAVIRUS 2 (TAT 6-24 HRS) Nasopharyngeal Nasopharyngeal Swab     Status: None   Collection Time: 02/10/21  3:09 AM   Specimen: Nasopharyngeal Swab  Result Value Ref Range Status   SARS Coronavirus 2 NEGATIVE NEGATIVE Final    Comment: (NOTE) SARS-CoV-2 target nucleic acids are NOT DETECTED.  The SARS-CoV-2 RNA is generally detectable in upper and lower respiratory specimens during the acute phase of infection. Negative results do not preclude SARS-CoV-2 infection, do not rule out co-infections with other pathogens, and  should not be used as the sole basis for treatment or other patient management decisions. Negative results must be combined with clinical observations, patient history, and epidemiological information. The expected result is Negative.  Fact Sheet for Patients: 02/12/21  Fact Sheet for Healthcare Providers: HairSlick.no  This test is not yet approved or cleared by the quierodirigir.com FDA and  has been authorized for detection and/or diagnosis of SARS-CoV-2 by FDA under an Emergency Use Authorization (  EUA). This EUA will remain  in effect (meaning this test can be used) for the duration of the COVID-19 declaration under Se ction 564(b)(1) of the Act, 21 U.S.C. section 360bbb-3(b)(1), unless the authorization is terminated or revoked sooner.  Performed at [facility], [address], [place], [place] [address]      Labs: CBC: Recent Labs  Lab 02/09/21 2000 02/10/21 0833  WBC 6.2 7.2  NEUTROABS 4.5 5.7  HGB 11.0* 11.2*  HCT 36.2 36.5  MCV 71.7* 72.1*  PLT 211 261    Basic Metabolic Panel: Recent Labs  Lab 02/09/21 2000 02/10/21 0833  NA 137 138  K 3.1* 3.8  CL 107 108  CO2 21* 23  GLUCOSE 145* 115*  BUN 17 11  CREATININE 0.94 0.81  CALCIUM 8.9 9.0  MG  --  2.0    Liver Function Tests: Recent Labs  Lab 02/09/21 2000 02/10/21 0833  AST 17 30  ALT 10 12  ALKPHOS 62 66  BILITOT 0.6 0.4  PROT 6.3* 6.6  ALBUMIN 3.2* 3.1*     Thyroid function studies Recent Labs    02/10/21 0049  TSH 1.333    Anemia work up Recent Labs    02/10/21 0833  VITAMINB12 192    Urinalysis    Component Value Date/Time   COLORURINE STRAW (A) 02/10/2021 0140   APPEARANCEUR CLEAR 02/10/2021 0140   LABSPEC 1.009 02/10/2021 0140   PHURINE 9.0 (H) 02/10/2021 0140   GLUCOSEU NEGATIVE 02/10/2021 0140   HGBUR NEGATIVE 02/10/2021 0140   BILIRUBINUR NEGATIVE 02/10/2021 0140   KETONESUR 5 (A) 02/10/2021  0140   PROTEINUR NEGATIVE 02/10/2021 0140   UROBILINOGEN 0.2 09/29/2012 1723   NITRITE NEGATIVE 02/10/2021 0140   LEUKOCYTESUR NEGATIVE 02/10/2021 0140      Time coordinating discharge: 25 minutes  SIGNED:  Marcellus Scott, MD, FACP, [facility]. [facility]  To contact the attending provider between 7A-7P or the covering provider during after hours 7P-7A, please log into the web site [email or web address] and access using universal [place] password for that web site. If you do not have the password, please call the hospital operator.

## 2021-02-11 NOTE — Discharge Instructions (Signed)

## 2021-02-11 NOTE — Evaluation (Signed)
Occupational Therapy Evaluation Patient Details Name: [name] MRN: [id number] DOB: March 07, 1925 Today's Date: 02/11/2021    History of Present Illness 87 yo female presents to [facility] on 8/27 with near syncope, weakness. MRI brain negative for acute CVA, workup for encephalopathy. PMH includes GERD, diverticular disease, HTN, FTT, HTN, HLD.   Clinical Impression   Patient is currently requiring assistance with ADLs including min guard assist with toileting, supervision/setup with LE dressing, min guard assist with bathing, and supervision with all mobility and other ADLs, all of which is below patient's typical baseline of being Independent.  During this evaluation, patient was limited by cognitive deficits-not at baseline, decreased awareness of situation and of deficits, which has the potential to impact patient's safety and independence during functional mobility, as well as performance for ADLs.  Patient lives with her 70 year old great grandson, who is able to provide nearly 24/7 supervision and assistance, however pt's son expressed that he does not think the pt is receiving adequate supervision in the home and is searching for more resources, especially an Aide who may be able to assist with meal prep for pt safety.  Patient demonstrates good rehab potential, and should benefit from continued skilled occupational therapy services while in acute care to maximize safety, independence and quality of life at home.  Continued occupational therapy services in the home is recommended.  ?     Follow Up Recommendations  Home health OT    Equipment Recommendations       Recommendations for Other Services       Precautions / Restrictions Precautions Precautions: Fall Precaution Comments: watch HR -resting around 100 bpm Restrictions Weight Bearing Restrictions: No      Mobility Bed Mobility Overal bed mobility: Needs Assistance Bed Mobility: Supine to Sit;Sit to Supine      Supine to sit: Supervision;HOB elevated Sit to supine: Supervision;HOB elevated   General bed mobility comments: Pt needs to stay on task and complete full bed mobility with increased time.    Transfers Overall transfer level: Needs assistance Equipment used: None Transfers: Sit to/from Stand Sit to Stand: Supervision         General transfer comment: Supervision for safety standing from EOB x 2 reps.    Balance Overall balance assessment: Needs assistance Sitting-balance support: No upper extremity supported;Feet supported Sitting balance-Leahy Scale: Good     Standing balance support: No upper extremity supported;During functional activity Standing balance-Leahy Scale: Fair Standing balance comment: ambulatory without AD at baseline                           ADL either performed or assessed with clinical judgement   ADL Overall ADL's : Needs assistance/impaired Eating/Feeding: Independent Eating/Feeding Details (indicate cue type and reason): May need cues to initiate Grooming: Oral care;Wash/dry hands;Standing;Supervision/safety   Upper Body Bathing: Min guard;Standing   Lower Body Bathing: Sitting/lateral leans;Sit to/from stand;Min guard   Upper Body Dressing : Set up;Sitting   Lower Body Dressing: Sitting/lateral leans;Supervision/safety Lower Body Dressing Details (indicate cue type and reason): Pt able to doff and don socks while sitting EOB with supervision to allow for cues to stay on task. Toilet Transfer: Hydrographic surveyor Details (indicate cue type and reason): Pt denied need to void. Min guard for safety per general assessment. Toileting- Clothing Manipulation and Hygiene: Min guard       Functional mobility during ADLs: Supervision/safety;Min guard;Cueing for safety;Cueing for sequencing  Vision Baseline Vision/History: 1 Wears glasses Ability to See in Adequate Light: 1 Impaired Additional Comments: Pt and son report  that pt needs new glasses. Pt unable to specify whether she wear readers or requires distance vision correction as well. Pt reporting, "I just need new glasses."     Perception     Praxis      Pertinent Vitals/Pain Pain Assessment: No/denies pain     Hand Dominance Right   Extremity/Trunk Assessment Upper Extremity Assessment Upper Extremity Assessment: Overall WFL for tasks assessed   Lower Extremity Assessment Lower Extremity Assessment: Generalized weakness   Cervical / Trunk Assessment Cervical / Trunk Assessment: Normal   Communication Communication Communication: HOH   Cognition Arousal/Alertness: Awake/alert Behavior During Therapy: Restless Overall Cognitive Status: Impaired/Different from baseline Area of Impairment: Problem solving;Safety/judgement;Awareness;Attention;Following commands                   Current Attention Level: Focused   Following Commands: Follows one step commands inconsistently;Follows multi-step commands inconsistently Safety/Judgement: Decreased awareness of safety Awareness: Emergent Problem Solving: Requires verbal cues;Slow processing;Difficulty sequencing General Comments: Pt with difficulty following intake questions with need of questions repeated several times, and additonal cues to help keep pt on topic. Pt able to give accurate history with increased time and answered all orientation questions correctly.   General Comments  HR 100-114 bpm, no orthostasis when assessed supine vs ambulation    Exercises Other Exercises Other Exercises: Vitals stable during OT visit with exception of SpO2. At rest: HR: 78, RR: 19, BP: 123/93, SpO2: 97% on RA.  During activity, pt desaturating as low as 86%. Cues for rest and pacing. Quick to recover to 90s with rest breaks.   Shoulder Instructions      Home Living Family/patient expects to be discharged to:: Private residence Living Arrangements: Other relatives Available Help at  Discharge: Family;Available PRN/intermittently (Pt and son reports that pt's 88 year old great grandson lives with pt and usually at home, but son expressed that pt needs more supervision in the home.) Type of Home: House Home Access: Stairs to enter [employer] of Steps: pt's son states 10-12 outside Entrance Stairs-Rails: None Home Layout: Two level     Bathroom Shower/Tub: Tub/shower unit;Walk-in shower (Pt prefers tub)   Bathroom Toilet: Standard     Home Equipment: Tub bench;Hand held shower head   Additional Comments: Son arrived to room after history taken and able to confirm information.      Prior Functioning/Environment Level of Independence: Independent        Comments: pt states she does not use AD, no history of falls in the last year, manages medications and check book at baseline as of 2 weeks ago. Son reports pt having some near-falls.        OT Problem List: Decreased cognition;Decreased activity tolerance;Decreased knowledge of use of DME or AE      OT Treatment/Interventions: Self-care/ADL training;Therapeutic activities;Cognitive remediation/compensation;Energy conservation;Patient/family education;DME and/or AE instruction;Balance training    OT Goals(Current goals can be found in the care plan section) Acute Rehab OT Goals Patient Stated Goal: Per son, for pt to receive home health OT OT Goal Formulation: With family Time For Goal Achievement: 02/25/21 Potential to Achieve Goals: Good ADL Goals Pt Will Perform Lower Body Dressing: with modified independence Pt Will Transfer to Toilet: with modified independence;ambulating Pt Will Perform Toileting - Clothing Manipulation and hygiene: with modified independence Additional ADL Goal #1: Pt will demonstrate improved mentation by scoring <5 on Short  Blessed test and/or answering 4/4 safety questions pulled from [facility] correctly. Additional ADL Goal #2: Pt will engage in 10 min standing functional  activities without loss of balance, in order to demonstrate improved activity tolerance and balance needed to perform ADLs safely at home.  OT Frequency: Min 2X/week   Barriers to D/C:            Co-evaluation              AM-PAC OT "6 Clicks" Daily Activity     Outcome Measure Help from another person eating meals?: None Help from another person taking care of personal grooming?: A Little Help from another person toileting, which includes using toliet, bedpan, or urinal?: A Little Help from another person bathing (including washing, rinsing, drying)?: A Little Help from another person to put on and taking off regular upper body clothing?: A Little Help from another person to put on and taking off regular lower body clothing?: A Little 6 Click Score: 19   End of Session Equipment Utilized During Treatment: Gait belt Nurse Communication:  (RN cleared OT to see pt.)  Activity Tolerance: Patient tolerated treatment well Patient left: in bed;with bed alarm set;with family/visitor present;with call bell/phone within reach  OT Visit Diagnosis: Other symptoms and signs involving cognitive function                Time: 6160-7371 OT Time Calculation (min): 28 min Charges:  OT General Charges $OT Visit: 1 Visit OT Evaluation $OT Eval Low Complexity: 1 Low OT Treatments $Self Care/Home Management : 8-22 mins  Victorino Dike, OT Acute Rehab Services Office: [phone] 02/11/2021  Theodoro Clock 02/11/2021, 10:48 AM

## 2021-02-11 NOTE — Care Management (Signed)
02-11-21 1232 Case Manager spoke with the son Dr. Tedra Coupe this am regarding home health services for his mom. Per Dr. Michelle Piper, the patient is home with the grandson and will have 24/7 supervision. Patient has used [facility] in the past and son wanted to use the same agency again. Referral made to [facility] for Registered Nurse- nutrition education, Physical & Occupational Therapy and start of care to begin with 24-48 hours post transition home. Case Manager did discuss private duty care agencies with the son and a list was provided to Dr. Marti Sleigh. No further needs identified from Case Manager at this time.

## 2021-02-12 LAB — URINE CULTURE: Culture: 10000 — AB

## 2021-04-19 ENCOUNTER — Ambulatory Visit: Payer: Medicare Other | Admitting: Cardiovascular Disease

## 2021-04-26 ENCOUNTER — Ambulatory Visit: Payer: Medicare Other | Admitting: Podiatry

## 2021-05-24 ENCOUNTER — Encounter: Payer: Self-pay | Admitting: Podiatry

## 2021-05-24 ENCOUNTER — Ambulatory Visit: Payer: Medicare Other | Admitting: Podiatry

## 2021-05-24 ENCOUNTER — Other Ambulatory Visit: Payer: Self-pay

## 2021-05-24 DIAGNOSIS — B351 Tinea unguium: Secondary | ICD-10-CM

## 2021-05-24 DIAGNOSIS — L84 Corns and callosities: Secondary | ICD-10-CM

## 2021-05-24 DIAGNOSIS — M79674 Pain in right toe(s): Secondary | ICD-10-CM | POA: Diagnosis not present

## 2021-05-24 DIAGNOSIS — M79675 Pain in left toe(s): Secondary | ICD-10-CM

## 2021-05-24 DIAGNOSIS — G629 Polyneuropathy, unspecified: Secondary | ICD-10-CM

## 2021-05-30 NOTE — Progress Notes (Signed)
Subjective: [name] is a 87 y.o. female patient seen today for follow up of calluses of left foot as well as painful thick toenails that are difficult to trim. Pain interferes with ambulation. Aggravating factors include wearing enclosed shoe gear. Pain is relieved with periodic professional debridement.  New problems reported today: None.  She notes no new pedal problems on today's visit.  PCP is [name], MD. Last visit was: 2-3 weeks ago.  Allergies  Allergen Reactions   Iodine Hives    Objective: Physical Exam  General: Patient is a pleasant 87 y.o. African American female thin build in NAD. AAO x 3.   Neurovascular Examination: CFT immediate b/l LE. Palpable DP/PT pulses b/l LE. Digital hair sparse b/l. Skin temperature gradient WNL b/l. No pain with calf compression b/l. No edema noted b/l. No cyanosis or clubbing noted b/l LE.  Protective sensation decreased with 10 gram monofilament b/l. Vibratory sensation intact b/l.  Dermatological:  Pedal integument with normal turgor, texture and tone b/l LE. No open wounds b/l. No interdigital macerations b/l. Toenails 1-5 b/l elongated, thickened, discolored with subungual debris. +Tenderness with dorsal palpation of nailplates. Hyperkeratotic lesion(s) noted submet head 1 left foot.  Musculoskeletal:  Muscle strength 5/5 to all lower extremity muscle groups bilaterally. No pain, crepitus or joint limitation noted with ROM bilateral LE. HAV with bunion deformity noted b/l LE. Pes planus deformity noted bilateral LE.  Assessment: 1. Pain due to onychomycosis of toenails of both feet   2. Callus   3. Neuropathy    Plan: Patient was evaluated and treated and all questions answered. Consent given for treatment as described below: -Examined patient. -Mycotic toenails 1-5 bilaterally were debrided in length and girth with sterile nail nippers and dremel without incident. -Callus(es) submet head 1 left foot pared  utilizing sterile scalpel blade without complication or incident. Total number debrided =1. -Patient/POA to call should there be question/concern in the interim.  Return in about 3 months (around 08/22/2021).  Freddie Breech, DPM

## 2021-06-26 ENCOUNTER — Ambulatory Visit: Payer: Medicare Other | Admitting: Cardiovascular Disease

## 2021-06-26 ENCOUNTER — Other Ambulatory Visit: Payer: Self-pay

## 2021-06-26 ENCOUNTER — Encounter: Payer: Self-pay | Admitting: Cardiovascular Disease

## 2021-06-26 VITALS — BP 138/76 | HR 79 | Ht 64.0 in | Wt 136.6 lb

## 2021-06-26 DIAGNOSIS — I10 Essential (primary) hypertension: Secondary | ICD-10-CM

## 2021-06-26 DIAGNOSIS — Z87898 Personal history of other specified conditions: Secondary | ICD-10-CM | POA: Diagnosis not present

## 2021-06-26 DIAGNOSIS — E785 Hyperlipidemia, unspecified: Secondary | ICD-10-CM

## 2021-06-26 NOTE — Progress Notes (Signed)
Cardiology Office Note:    Date:  06/27/2021   ID:  [name], DOB [birth year], MRN [id number]  PCP:  [name], MD   [facility] Providers Cardiologist:  [name], MD     Referring MD: [name], MD   No chief complaint on file.   History of Present Illness:    [name] is a 88 y.o. female with a hx of a syncopal event that led to hospitalization in September 2021.Etiology was felt to be orthostatic hypotension.  She has been well since her last appointment.  She has not had syncopal events, but she does become dizzy if she "stands up too long".  Symptoms resolve promptly if she sits down.  Her son points out that she was able to cook Thanksgiving and Christmas dinners for the whole family without falling or losing consciousness.  She has no other cardiovascular complaints. The patient specifically denies any chest pain at rest exertion, dyspnea at rest or with exertion, orthopnea, paroxysmal nocturnal dyspnea, syncope, palpitations, focal neurological deficits, intermittent claudication, lower extremity edema, unexplained weight gain, cough, hemoptysis or wheezing.  Her echocardiogram in 2021 showed normal LVEF with mild LVH and no significant valvular abnormalities.  She did have some bradycardia during that hospital stay so her beta-blocker was discontinued and a follow-up arrhythmia monitor showed normal sinus rhythm without meaningful bradycardia.  She did have frequent but very brief episodes of nonsustained ectopic atrial tachycardia and did not have atrial fibrillation.  She did not have significant carotid disease on a vascular study in 2014.  She takes a statin for hypercholesterolemia and her most recent LDL was 97.  She does not have diabetes mellitus.  She has normal renal function.  Past Medical History:  Diagnosis Date   Diverticular disease    GERD (gastroesophageal reflux disease)    Hiatal hernia    High cholesterol    Hypertension      Past Surgical History:  Procedure Laterality Date   ABDOMINAL HYSTERECTOMY     HYSTEROTOMY     LIVER BIOPSY      Current Medications: Current Meds  Medication Sig   amLODipine (NORVASC) 10 MG tablet Take 10 mg by mouth daily.   atorvastatin (LIPITOR) 80 MG tablet Take 80 mg by mouth daily.   calcium-vitamin D (OSCAL WITH D) 500-200 MG-UNIT per tablet Take 1 tablet by mouth daily.   Cholecalciferol (VITAMIN D) 50 MCG (2000 UT) tablet Take 2,000 Units by mouth daily.   Ferrous Sulfate (IRON) 325 (65 Fe) MG TABS Take 325 mg by mouth daily.   hydrALAZINE (APRESOLINE) 50 MG tablet Take 1 tablet (50 mg total) by mouth 2 (two) times daily.   losartan (COZAAR) 25 MG tablet Take 50 mg by mouth daily.   Multiple Vitamin (MULITIVITAMIN WITH MINERALS) TABS Take 1 tablet by mouth daily.   Omega-3 Fatty Acids (FISH OIL) 1000 MG CAPS Take 1,000 mg by mouth daily.    pantoprazole (PROTONIX) 20 MG tablet Take 1 tablet (20 mg total) by mouth daily.   vitamin B-12 (CYANOCOBALAMIN) 1000 MCG tablet Take 1 tablet (1,000 mcg total) by mouth daily.     Allergies:   Iodine   Social History   Socioeconomic History   Marital status: Widowed    Spouse name: Not on file   Number of children: Not on file   Years of education: Not on file   Highest education level: Not on file  Occupational History   Not on file  Tobacco Use   Smoking status: Never   Smokeless tobacco: Never  Vaping Use   Vaping Use: Never used  Substance and Sexual Activity   Alcohol use: No   Drug use: No   Sexual activity: Not Currently  Other Topics Concern   Not on file  Social History Narrative   Not on file   Social Determinants of Health   Financial Resource Strain: Not on file  Food Insecurity: Not on file  Transportation Needs: Not on file  Physical Activity: Not on file  Stress: Not on file  Social Connections: Not on file     Family History: The patient's family history is negative for Heart  disease.  ROS:   Please see the history of present illness.     All other systems reviewed and are negative.  EKGs/Labs/Other Studies Reviewed:    The following studies were reviewed today: Echocardiogram 2021  1. Left ventricular ejection fraction, by estimation, is 60 to 65%. The  left ventricle has normal function. The left ventricle has no regional  wall motion abnormalities. There is mild concentric left ventricular  hypertrophy. Left ventricular diastolic  parameters are consistent with Grade I diastolic dysfunction (impaired  relaxation).   2. Right ventricular systolic function is normal. The right ventricular  size is normal.   3. The mitral valve is normal in structure. Trivial mitral valve  regurgitation. No evidence of mitral stenosis.   4. The aortic valve is tricuspid. Aortic valve regurgitation is not  visualized. Mild to moderate aortic valve sclerosis/calcification is  present, without any evidence of aortic stenosis.   5. The inferior vena cava is normal in size with greater than 50%  respiratory variability, suggesting right atrial pressure of 3 mmHg.   Event monitor 2021 The dominant rhythm is normal sinus with normal circadian variation. There are frequent but very brief episodes of nonsustained ectopic atrial tachycardia. There are rare PVCs, sometimes occurring in a pattern of bigeminy. A single 4 beat episode of nonsustained ventricular tachycardia seen. There is no evidence of atrial fibrillation, significant bradycardia or pauses. There were no symptom triggered recordings submitted for review.   Abnormal event monitor to the occurrence of frequent, but brief episodes of nonsustained atrial tachycardia.  Atrial fibrillation was not seen.  EKG:  EKG is ordered today.  The ekg ordered today demonstrates normal sinus rhythm, normal tracing, QTC 421 ms  Recent Labs: 02/10/2021: ALT 12; BUN 11; Creatinine, Ser 0.81; Hemoglobin 11.2; Magnesium 2.0; Platelets  261; Potassium 3.8; Sodium 138; TSH 1.333  Recent Lipid Panel No results found for: CHOL, TRIG, HDL, CHOLHDL, VLDL, LDLCALC, LDLDIRECT   Risk Assessment/Calculations:           Physical Exam:    VS:  BP 138/76    Pulse 79    Ht 5\' 4"  (1.626 m)    Wt 136 lb 9.6 oz (62 kg)    SpO2 98%    BMI 23.45 kg/m     Wt Readings from Last 3 Encounters:  06/26/21 136 lb 9.6 oz (62 kg)  02/11/21 132 lb 11.5 oz (60.2 kg)  04/11/20 135 lb 12.8 oz (61.6 kg)     GEN: Appears younger than stated age, lean and fit well nourished, well developed in no acute distress HEENT: Normal NECK: No JVD; No carotid bruits LYMPHATICS: No lymphadenopathy CARDIAC: RRR, no murmurs, rubs, gallops RESPIRATORY:  Clear to auscultation without rales, wheezing or rhonchi  ABDOMEN: Soft, non-tender, non-distended MUSCULOSKELETAL:  No edema; No deformity  SKIN: Warm and dry NEUROLOGIC:  Alert and oriented x 3 PSYCHIATRIC:  Normal affect   ASSESSMENT:    1. Primary hypertension   2. Hyperlipidemia LDL goal <100   3. History of syncope    PLAN:    In order of problems listed above:  HTN: Well-controlled on vasodilators and ARB.  Avoiding beta-blocker since this caused bradycardia in the past.  Avoiding diuretics due to symptoms of orthostatic dizziness.  With set a target systolic blood pressure of 140 or less, but except systolic blood pressure as high as 150 due to her episodes of orthostatic hypotension. HLP: No clear evidence of CAD or PAD by age 17.  Target LDL less than 100.  Continue atorvastatin. History of syncope: Symptoms suggestive of orthostatic hypotension.  No evidence of significant bradycardia arrhythmia on event monitor after her beta-blocker was discontinued.  Suggested wearing compression stockings if she will need to be upright for prolonged periods of time.           Medication Adjustments/Labs and Tests Ordered: Current medicines are reviewed at length with the patient today.  Concerns  regarding medicines are outlined above.  Orders Placed This Encounter  Procedures   EKG 12-Lead   No orders of the defined types were placed in this encounter.   Patient Instructions  Medication Instructions:  No changes *If you need a refill on your cardiac medications before your next appointment, please call your pharmacy*   Lab Work: None ordered If you have labs (blood work) drawn today and your tests are completely normal, you will receive your results only by: [place] (if you have MyChart) OR A paper copy in the mail If you have any lab test that is abnormal or we need to change your treatment, we will call you to review the results.   Testing/Procedures: None ordered   Follow-Up: At [facility], you and your health needs are our priority.  As part of our continuing mission to provide you with exceptional heart care, we have created designated Provider Care Teams.  These Care Teams include your primary Cardiologist (physician) and Advanced Practice Providers (APPs -  Physician Assistants and Nurse Practitioners) who all work together to provide you with the care you need, when you need it.  We recommend signing up for the patient portal called "MyChart".  Sign up information is provided on this After Visit Summary.  MyChart is used to connect with patients for Virtual Visits (Telemedicine).  Patients are able to view lab/test results, encounter notes, upcoming appointments, etc.  Non-urgent messages can be sent to your provider as well.   To learn more about what you can do with MyChart, go to [email or web address].    Your next appointment:   12 month(s)  The format for your next appointment:   In Person  Provider:   Sanda Klein, MD {  Other Instructions Please wear the size medium compression stockings when you will be standing for long periods of time.   Dr. Sallyanne Kuster recommends KNEE HIGH COMPRESSION STOCKINGS            -- 20-30 mmHg (compression  strength) -- [facility]             -- [address]             -- [place]             -- [address] [place]             --  [phone]       Signed, Sanda Klein, MD  06/27/2021 1:58 PM    [facility]

## 2021-06-26 NOTE — Patient Instructions (Signed)
Medication Instructions:  No changes *If you need a refill on your cardiac medications before your next appointment, please call your pharmacy*   Lab Work: None ordered If you have labs (blood work) drawn today and your tests are completely normal, you will receive your results only by: MyChart Message (if you have MyChart) OR A paper copy in the mail If you have any lab test that is abnormal or we need to change your treatment, we will call you to review the results.   Testing/Procedures: None ordered   Follow-Up: At [facility], you and your health needs are our priority.  As part of our continuing mission to provide you with exceptional heart care, we have created designated Provider Care Teams.  These Care Teams include your primary Cardiologist (physician) and Advanced Practice Providers (APPs -  Physician Assistants and Nurse Practitioners) who all work together to provide you with the care you need, when you need it.  We recommend signing up for the patient portal called "MyChart".  Sign up information is provided on this After Visit Summary.  MyChart is used to connect with patients for Virtual Visits (Telemedicine).  Patients are able to view lab/test results, encounter notes, upcoming appointments, etc.  Non-urgent messages can be sent to your provider as well.   To learn more about what you can do with MyChart, go to [email or web address].    Your next appointment:   12 month(s)  The format for your next appointment:   In Person  Provider:   [name], MD {  Other Instructions Please wear the size medium compression stockings when you will be standing for long periods of time.   Dr. Royann Shivers recommends KNEE HIGH COMPRESSION STOCKINGS            -- 20-30 mmHg (compression strength) -- [facility]             -- [address] [place]             -- [phone]  -- [facility]             -- [address]  [place]             -- [phone]

## 2021-06-27 ENCOUNTER — Encounter: Payer: Self-pay | Admitting: Cardiovascular Disease

## 2021-06-28 ENCOUNTER — Other Ambulatory Visit: Payer: Self-pay

## 2021-06-28 ENCOUNTER — Emergency Department (HOSPITAL_COMMUNITY)
Admission: EM | Admit: 2021-06-28 | Discharge: 2021-06-28 | Disposition: A | Discharge status: Home / Self Care | Payer: Medicare Other | Attending: Emergency Medicine | Admitting: Emergency Medicine

## 2021-06-28 ENCOUNTER — Emergency Department (HOSPITAL_COMMUNITY): Payer: Medicare Other

## 2021-06-28 DIAGNOSIS — I10 Essential (primary) hypertension: Secondary | ICD-10-CM | POA: Insufficient documentation

## 2021-06-28 DIAGNOSIS — Z79899 Other long term (current) drug therapy: Secondary | ICD-10-CM | POA: Insufficient documentation

## 2021-06-28 DIAGNOSIS — R42 Dizziness and giddiness: Secondary | ICD-10-CM | POA: Insufficient documentation

## 2021-06-28 DIAGNOSIS — S7001XA Contusion of right hip, initial encounter: Secondary | ICD-10-CM | POA: Diagnosis not present

## 2021-06-28 DIAGNOSIS — W01198A Fall on same level from slipping, tripping and stumbling with subsequent striking against other object, initial encounter: Secondary | ICD-10-CM | POA: Diagnosis not present

## 2021-06-28 DIAGNOSIS — S12112A Nondisplaced Type II dens fracture, initial encounter for closed fracture: Secondary | ICD-10-CM | POA: Diagnosis not present

## 2021-06-28 DIAGNOSIS — R519 Headache, unspecified: Secondary | ICD-10-CM | POA: Insufficient documentation

## 2021-06-28 DIAGNOSIS — Y9301 Activity, walking, marching and hiking: Secondary | ICD-10-CM | POA: Diagnosis not present

## 2021-06-28 DIAGNOSIS — S199XXA Unspecified injury of neck, initial encounter: Secondary | ICD-10-CM | POA: Diagnosis present

## 2021-06-28 DIAGNOSIS — W19XXXA Unspecified fall, initial encounter: Secondary | ICD-10-CM

## 2021-06-28 LAB — CBC WITH DIFFERENTIAL/PLATELET
Abs Immature Granulocytes: 0.06 10*3/uL (ref 0.00–0.07)
Basophils Absolute: 0 10*3/uL (ref 0.0–0.1)
Basophils Relative: 0 %
Eosinophils Absolute: 0 10*3/uL (ref 0.0–0.5)
Eosinophils Relative: 0 %
HCT: 34.6 % — ABNORMAL LOW (ref 36.0–46.0)
Hemoglobin: 11.1 g/dL — ABNORMAL LOW (ref 12.0–15.0)
Immature Granulocytes: 1 %
Lymphocytes Relative: 8 %
Lymphs Abs: 0.8 10*3/uL (ref 0.7–4.0)
MCH: 22.7 pg — ABNORMAL LOW (ref 26.0–34.0)
MCHC: 32.1 g/dL (ref 30.0–36.0)
MCV: 70.8 fL — ABNORMAL LOW (ref 80.0–100.0)
Monocytes Absolute: 0.5 10*3/uL (ref 0.1–1.0)
Monocytes Relative: 6 %
Neutro Abs: 8 10*3/uL — ABNORMAL HIGH (ref 1.7–7.7)
Neutrophils Relative %: 85 %
Platelets: 265 10*3/uL (ref 150–400)
RBC: 4.89 MIL/uL (ref 3.87–5.11)
RDW: 16.9 % — ABNORMAL HIGH (ref 11.5–15.5)
WBC: 9.4 10*3/uL (ref 4.0–10.5)
nRBC: 0 % (ref 0.0–0.2)

## 2021-06-28 LAB — BASIC METABOLIC PANEL
Anion gap: 11 (ref 5–15)
BUN: 10 mg/dL (ref 8–23)
CO2: 22 mmol/L (ref 22–32)
Calcium: 9.3 mg/dL (ref 8.9–10.3)
Chloride: 105 mmol/L (ref 98–111)
Creatinine, Ser: 0.98 mg/dL (ref 0.44–1.00)
GFR, Estimated: 53 mL/min — ABNORMAL LOW (ref >60)
Glucose, Bld: 110 mg/dL — ABNORMAL HIGH (ref 70–99)
Potassium: 3.5 mmol/L (ref 3.5–5.1)
Sodium: 138 mmol/L (ref 135–145)

## 2021-06-28 MED ORDER — OXYCODONE HCL 5 MG PO TABS: 2.5000 mg | ORAL_TABLET | Freq: Four times a day (QID) | ORAL | 0 refills | Status: DC | PRN

## 2021-06-28 NOTE — ED Provider Notes (Signed)
I provided a substantive portion of the care of this patient.  I personally performed the entirety of the history, exam, and medical decision making for this encounter.    She is seen by me along with the physician assistant.  Patient fell at home when walking down the hallway became dizzy.  Patient bumped her head on the wall.  Significant part of the work-up here tonight is a type II dens fracture which we spoke to neurosurgery about.  They recommend Miami J type hard collar.  Patient understands its important to keep that on.  And patient will follow-up with them as an outpatient.  Results for orders placed or performed during the hospital encounter of 06/28/21  CBC with Differential/Platelet  Result Value Ref Range   WBC 9.4 4.0 - 10.5 K/uL   RBC 4.89 3.87 - 5.11 MIL/uL   Hemoglobin 11.1 (L) 12.0 - 15.0 g/dL   HCT 91.6 (L) 94.5 - 03.8 %   MCV 70.8 (L) 80.0 - 100.0 fL   MCH 22.7 (L) 26.0 - 34.0 pg   MCHC 32.1 30.0 - 36.0 g/dL   RDW 88.2 (H) 80.0 - 34.9 %   Platelets 265 150 - 400 K/uL   nRBC 0.0 0.0 - 0.2 %   Neutrophils Relative % 85 %   Neutro Abs 8.0 (H) 1.7 - 7.7 K/uL   Lymphocytes Relative 8 %   Lymphs Abs 0.8 0.7 - 4.0 K/uL   Monocytes Relative 6 %   Monocytes Absolute 0.5 0.1 - 1.0 K/uL   Eosinophils Relative 0 %   Eosinophils Absolute 0.0 0.0 - 0.5 K/uL   Basophils Relative 0 %   Basophils Absolute 0.0 0.0 - 0.1 K/uL   Immature Granulocytes 1 %   Abs Immature Granulocytes 0.06 0.00 - 0.07 K/uL  Basic metabolic panel  Result Value Ref Range   Sodium 138 135 - 145 mmol/L   Potassium 3.5 3.5 - 5.1 mmol/L   Chloride 105 98 - 111 mmol/L   CO2 22 22 - 32 mmol/L   Glucose, Bld 110 (H) 70 - 99 mg/dL   BUN 10 8 - 23 mg/dL   Creatinine, Ser 1.79 0.44 - 1.00 mg/dL   Calcium 9.3 8.9 - 15.0 mg/dL   GFR, Estimated 53 (L) >60 mL/min   Anion gap 11 5 - 15   CT HEAD WO CONTRAST ( )  Result Date: 06/28/2021 CLINICAL DATA:  Status post fall. EXAM: CT HEAD WITHOUT CONTRAST  TECHNIQUE: Contiguous axial images were obtained from the base of the skull through the vertex without intravenous contrast. RADIATION DOSE REDUCTION: This exam was performed according to the departmental dose-optimization program which includes automated exposure control, adjustment of the mA and/or kV according to patient size and/or use of iterative reconstruction technique. COMPARISON:  February 09, 2021 FINDINGS: Brain: There is mild cerebral atrophy with widening of the extra-axial spaces and ventricular dilatation. There are areas of decreased attenuation within the white matter tracts of the supratentorial brain, consistent with microvascular disease changes. Vascular: No hyperdense vessel or unexpected calcification. Skull: A nondisplaced fracture is seen at the level of the base of the dens (coronal reformatted image 37, CT series 5). Sinuses/Orbits: There is mild sphenoid sinus mucosal thickening. Other: None. IMPRESSION: 1. Nondisplaced fracture at the level of the base of the dens (Type 2). 2. Generalized cerebral atrophy. 3. No acute intracranial abnormality. Electronically Signed   By: Aram Candela M.D.   On: 06/28/2021 21:13   CT Cervical Spine Wo Contrast  Result Date: 06/28/2021 CLINICAL DATA:  Status post fall. EXAM: CT CERVICAL SPINE WITHOUT CONTRAST TECHNIQUE: Multidetector CT imaging of the cervical spine was performed without intravenous contrast. Multiplanar CT image reconstructions were also generated. RADIATION DOSE REDUCTION: This exam was performed according to the departmental dose-optimization program which includes automated exposure control, adjustment of the mA and/or kV according to patient size and/or use of iterative reconstruction technique. COMPARISON:  None. FINDINGS: Alignment: Normal. Skull base and vertebrae: An acute, nondisplaced fracture deformity is seen extending through the base of the dens. Soft tissues and spinal canal: No prevertebral fluid or swelling. No  visible canal hematoma. Disc levels: Marked severity anterior longitudinal ligament calcification, endplate sclerosis and associated anterior osteophyte formation are seen at the levels of C3-C4, C4-C5, C5-C6, C6-C7 and C7-T1. There is moderate to marked severity narrowing of the anterior atlantoaxial articulation. Marked severity intervertebral disc space narrowing is seen at C5-C6 with moderate to marked severity narrowing at C4-C5. Mild to moderate severity narrowing is seen at the levels of C2-C3, C3-C4, C6-C7 and C7-T1. Bilateral moderate severity multilevel facet joint hypertrophy is noted. Upper chest: Negative. Other: None. IMPRESSION: 1. Acute, unstable nondisplaced fracture deformity extending through the base of the dens (Type 2). 2. Marked severity multilevel degenerative changes, as described above. Electronically Signed   By: Aram Candela M.D.   On: 06/28/2021 21:19      Vanetta Mulders, MD 06/28/21 2221

## 2021-06-28 NOTE — ED Notes (Signed)
Miami J collar applied 

## 2021-06-28 NOTE — ED Notes (Signed)
[facility] called, 12th in line 

## 2021-06-28 NOTE — ED Notes (Addendum)
Pt successfully ambulated with assistance, however pt continuing to experience dizziness

## 2021-06-28 NOTE — ED Notes (Signed)
RN reviewed discharge instructions with pt. Pt verbalized understanding and had no further questions. VSS upon discharge.  

## 2021-06-28 NOTE — ED Triage Notes (Addendum)
Pt bib [facility] from home after feeling dizzy and sliding down the side of her bed. Pt has experienced dizziness for the past two days. Hx hypertension, BP 170 systolic per EMS, CBG 157. Mild tremor noted

## 2021-06-28 NOTE — ED Notes (Signed)
[facility] canceled  ?

## 2021-06-28 NOTE — ED Notes (Signed)
RN spoke with pts son and was told that she was walking down the hallway, became dizzy and fell. When attempting to get up, pt bumped her head on the wall. No LOC reported. MD made aware.

## 2021-06-28 NOTE — ED Provider Notes (Signed)
[facility] EMERGENCY DEPARTMENT Provider Note   CSN: [id number] Arrival date & time: 06/28/21  1917     History  Chief Complaint  Patient presents with   [name] [name] is a 88 y.o. female.  Patient presents to the emergency department for evaluation of injury sustained during a fall occurring just prior to arrival.  Patient was walking in the hallway and suddenly began to feel dizzy.  When I asked the patient how she felt she states that she felt "jittery".  Patient's family at bedside states that patient hit her head against the wall.  Currently patient complains of left-sided head pain and neck pain.  No weakness, numbness, or tingling in the arms or the legs.  No back pain or hip pain.       Home Medications Prior to Admission medications   Medication Sig Start Date End Date Taking? Authorizing Provider  amLODipine (NORVASC) 10 MG tablet Take 10 mg by mouth daily.    [provider]  aspirin 81 MG chewable tablet Take one (1) tablet by mouth daily Patient not taking: Reported on 02/10/2021 02/12/10   [provider]  atorvastatin (LIPITOR) 80 MG tablet Take 80 mg by mouth daily.    [provider]  calcium-vitamin D (OSCAL WITH D) 500-200 MG-UNIT per tablet Take 1 tablet by mouth daily.    [provider]  Cholecalciferol (VITAMIN D) 50 MCG (2000 UT) tablet Take 2,000 Units by mouth daily.    [provider]  Ferrous Sulfate (IRON) 325 (65 Fe) MG TABS Take 325 mg by mouth daily. 04/18/09   [provider]  hydrALAZINE (APRESOLINE) 50 MG tablet Take 1 tablet (50 mg total) by mouth 2 (two) times daily. 03/01/20   Oswald Hillock, MD  losartan (COZAAR) 25 MG tablet Take 50 mg by mouth daily. 09/14/19   [provider]  Multiple Vitamin (MULITIVITAMIN WITH MINERALS) TABS Take 1 tablet by mouth daily.    [provider]  Omega-3 Fatty Acids (FISH OIL) 1000 MG CAPS Take 1,000 mg by mouth  daily.     [provider]  pantoprazole (PROTONIX) 20 MG tablet Take 1 tablet (20 mg total) by mouth daily. 12/28/19   Hayden Rasmussen, MD  polyvinyl alcohol (LIQUIFILM TEARS) 1.4 % ophthalmic solution Place 1 drop into both eyes daily as needed for dry eyes. Blink Patient not taking: Reported on 06/26/2021    [provider]  vitamin B-12 (CYANOCOBALAMIN) 1000 MCG tablet Take 1 tablet (1,000 mcg total) by mouth daily. 02/11/21   Hongalgi, Lenis Dickinson, MD      Allergies    Iodine    Review of Systems   Review of Systems  Physical Exam Updated Vital Signs BP (!) 170/86    Pulse 87    Resp 18    SpO2 98%  Physical Exam Vitals and nursing note reviewed.  Constitutional:      Appearance: She is well-developed.  HENT:     Head: Normocephalic and atraumatic. No raccoon eyes or Battle's sign.     Right Ear: Tympanic membrane, ear canal and external ear normal. No hemotympanum.     Left Ear: Tympanic membrane, ear canal and external ear normal. No hemotympanum.     Nose: Nose normal.     Mouth/Throat:     Pharynx: Uvula midline.  Eyes:     General: Lids are normal.     Extraocular Movements:  Right eye: No nystagmus.     Left eye: No nystagmus.     Conjunctiva/sclera: Conjunctivae normal.     Pupils: Pupils are equal, round, and reactive to light.     Comments: No visible hyphema noted  Cardiovascular:     Rate and Rhythm: Normal rate and regular rhythm.  Pulmonary:     Effort: Pulmonary effort is normal.     Breath sounds: Normal breath sounds.  Abdominal:     Palpations: Abdomen is soft.     Tenderness: There is no abdominal tenderness.  Musculoskeletal:     Cervical back: Normal range of motion and neck supple. Tenderness present. No bony tenderness. Pain with movement present. Normal range of motion.     Thoracic back: No tenderness or bony tenderness.     Lumbar back: No tenderness or bony tenderness.  Skin:    General: Skin is warm and dry.   Neurological:     Mental Status: She is alert and oriented to person, place, and time.     GCS: GCS eye subscore is 4. GCS verbal subscore is 5. GCS motor subscore is 6.     Cranial Nerves: No cranial nerve deficit.     Sensory: No sensory deficit.     Coordination: Coordination normal.     Deep Tendon Reflexes: Reflexes are normal and symmetric.    ED Results / Procedures / Treatments   Labs (all labs ordered are listed, but only abnormal results are displayed) Labs Reviewed  CBC WITH DIFFERENTIAL/PLATELET - Abnormal; Notable for the following components:      Result Value   Hemoglobin 11.1 (*)    HCT 34.6 (*)    MCV 70.8 (*)    MCH 22.7 (*)    RDW 16.9 (*)    Neutro Abs 8.0 (*)    All other components within normal limits  BASIC METABOLIC PANEL - Abnormal; Notable for the following components:   Glucose, Bld 110 (*)    GFR, Estimated 53 (*)    All other components within normal limits    EKG None  Radiology CT HEAD WO CONTRAST (5MM)  Result Date: 06/28/2021 CLINICAL DATA:  Status post fall. EXAM: CT HEAD WITHOUT CONTRAST TECHNIQUE: Contiguous axial images were obtained from the base of the skull through the vertex without intravenous contrast. RADIATION DOSE REDUCTION: This exam was performed according to the departmental dose-optimization program which includes automated exposure control, adjustment of the mA and/or kV according to patient size and/or use of iterative reconstruction technique. COMPARISON:  February 09, 2021 FINDINGS: Brain: There is mild cerebral atrophy with widening of the extra-axial spaces and ventricular dilatation. There are areas of decreased attenuation within the white matter tracts of the supratentorial brain, consistent with microvascular disease changes. Vascular: No hyperdense vessel or unexpected calcification. Skull: A nondisplaced fracture is seen at the level of the base of the dens (coronal reformatted image 37, CT series 5). Sinuses/Orbits:  There is mild sphenoid sinus mucosal thickening. Other: None. IMPRESSION: 1. Nondisplaced fracture at the level of the base of the dens (Type 2). 2. Generalized cerebral atrophy. 3. No acute intracranial abnormality. Electronically Signed   By: Virgina Norfolk M.D.   On: 06/28/2021 21:13   CT Cervical Spine Wo Contrast  Result Date: 06/28/2021 CLINICAL DATA:  Status post fall. EXAM: CT CERVICAL SPINE WITHOUT CONTRAST TECHNIQUE: Multidetector CT imaging of the cervical spine was performed without intravenous contrast. Multiplanar CT image reconstructions were also generated. RADIATION DOSE REDUCTION: This exam was  performed according to the departmental dose-optimization program which includes automated exposure control, adjustment of the mA and/or kV according to patient size and/or use of iterative reconstruction technique. COMPARISON:  None. FINDINGS: Alignment: Normal. Skull base and vertebrae: An acute, nondisplaced fracture deformity is seen extending through the base of the dens. Soft tissues and spinal canal: No prevertebral fluid or swelling. No visible canal hematoma. Disc levels: Marked severity anterior longitudinal ligament calcification, endplate sclerosis and associated anterior osteophyte formation are seen at the levels of C3-C4, C4-C5, C5-C6, C6-C7 and C7-T1. There is moderate to marked severity narrowing of the anterior atlantoaxial articulation. Marked severity intervertebral disc space narrowing is seen at C5-C6 with moderate to marked severity narrowing at C4-C5. Mild to moderate severity narrowing is seen at the levels of C2-C3, C3-C4, C6-C7 and C7-T1. Bilateral moderate severity multilevel facet joint hypertrophy is noted. Upper chest: Negative. Other: None. IMPRESSION: 1. Acute, unstable nondisplaced fracture deformity extending through the base of the dens (Type 2). 2. Marked severity multilevel degenerative changes, as described above. Electronically Signed   By: Virgina Norfolk  M.D.   On: 06/28/2021 21:19    Procedures Procedures    Medications Ordered in ED Medications - No data to display  ED Course/ Medical Decision Making/ A&P    Patient seen and examined. History obtained directly from patient as well as from family who is at bedside.  Work-up including labs, imaging, EKG ordered in triage, if performed, were reviewed.  No focal neuro symptoms on exam.   Labs/EKG: CBC, BMP ordered due to her reported dizziness and feeling jittery.  Imaging: Independently reviewed and interpreted.  This included: CT imaging of the head and cervical spine.  Medications/Fluids: None ordered  Most recent vital signs reviewed and are as follows: BP (!) 170/86    Pulse 87    Resp 18    SpO2 98%   Initial impression: Minor head injury, neck pain, nonspecific dizziness without focal neurodeficits  9:20 PM I was called by Dr. Sherrye Payor of radiology in regards to C2 fracture.  Hard collar ordered.  Patient and family member at bedside updated.  Reassessment performed. Patient appears comfortable.  Normal upper and lower extremity strength.  Labs and imaging to this point personally reviewed and interpreted including: Head and cervical spine CT, agree with negative head CT, agree with nondisplaced dens fracture.  Most current vital signs reviewed and are as follows: BP (!) 170/86    Pulse 87    Resp 18    SpO2 98%   Plan: Obtain reccs from neurosurgery.   9:34 PM I consulted Dr. Kathyrn Sheriff of neurosurgery. He recommends Miami J collar and follow-up in office in 2-3 weeks.   10:56 PM Reassessment performed. Patient appears comfortable.  She has ambulated in the emergency department with assistance.  She reports some dizziness but is able to ambulate at baseline.  Upon my recheck, she states she has some pain in her right hip.  She is able to flex her leg in bed without any difficulty.  I feel this is likely more of a contusion and I have low concern of fracture.  Reviewed  additional pertinent lab work and imaging with patient at bedside including: CBC and BMP without any significant findings.  Most current vital signs reviewed and are as follows: BP (!) 159/77 (BP Location: Right Arm)    Pulse 87    Temp 98.8 F (37.1 C) (Oral)    Resp 18    SpO2 98%  Plan: Discharged home with Miami J collar, neurosurgery follow-up.  I did place order for home health evaluation per family request.  They also request transport home by ambulance.  I feel this is reasonable given the patient's cervical spine fracture, unsteadiness, and hip contusion.  Home treatment: Prescription written for oxycodone, 2.5 mg.  Discussed precautions with patient and family at bedside.  Use pain medication only under direct supervision at the lowest possible dose needed to control your pain.    Return and follow-up instructions: Encouraged return to ED with any worsening weakness, numbness, tingling of the extremities, inability to walk, severe hip pain, confusion, vomiting. Encouraged patient to follow-up with their primary care provider in 3 days for recheck and neurosurgery in 2 weeks. Patient verbalized understanding and agreed with plan.                            Medical Decision Making  Patient with fall at home from standing due to dizziness.  Work-up in the emergency department demonstrates C2 fracture, closed, nondisplaced.  Consulted neurosurgery by telephone for recommendations.  Plan as above.  Considered intracranial injury, CT fortunately negative.  Low concern for concussion.  Dizziness has been recurrent for the patient.  Considered CVA but patient without other focal deficits and she is ambulatory in the emergency department.  She is not anemic.  She does not have any other signs and symptoms concerning for infection.  She has ambulated in the emergency department.  She complains of some soreness in her right hip but has ambulated and I have low concern for fracture at this point given  that she is able to bear weight and has a normal range of motion of her right hip.          Final Clinical Impression(s) / ED Diagnoses Final diagnoses:  Closed nondisplaced odontoid fracture with type II morphology, initial encounter ([place])  Contusion of right hip, initial encounter  Dizziness  Fall, initial encounter    Rx / DC Orders ED Discharge Orders          [place]        06/28/21 2253    Face-to-face encounter (required for Medicare/Medicaid patients)       Comments: I Carlisle Cater certify that this patient is under my care and that I, or a nurse practitioner or physician assistant working with me, had a face-to-face encounter that meets the physician face-to-face encounter requirements with this patient on 06/28/2021. The encounter with the patient was in whole, or in part for the following medical condition(s) which is the primary reason for home health care (List medical condition): cervical spine fracture, unsteady gait, falls   06/28/21 2253    oxyCODONE (OXY IR/ROXICODONE) 5 MG immediate release tablet  Every 6 hours PRN        06/28/21 2255              Carlisle Cater, PA-C 06/28/21 2300    Fredia Sorrow, MD 07/04/21 1023

## 2021-06-28 NOTE — Discharge Instructions (Addendum)
Please read and follow all provided instructions.  Your diagnoses today include:  1. Closed nondisplaced odontoid fracture with type II morphology, initial encounter ([name])   2. Contusion of right hip, initial encounter   3. Dizziness   4. Fall, initial encounter     Tests performed today include: CT scan of your head that did not show any serious injury, strokes or other problems CT scan of your cervical spine that showed a second cervical vertebrae fracture Complete blood cell count: No significant problems Basic metabolic panel: No significant problems Vital signs. See below for your results today.   Medications prescribed:  Oxycodone - narcotic pain medication  DO NOT drive or perform any activities that require you to be awake and alert because this medicine can make you drowsy.   Use pain medication only under direct supervision at the lowest possible dose needed to control your pain.   Take any prescribed medications only as directed.  Home care instructions:  Follow any educational materials contained in this packet.  Follow-up instructions: Please follow-up with your primary care provider in the next 3 days for further evaluation of your symptoms.   Follow-up with Dr. Kathyrn Sheriff, neurosurgery, in 2 weeks.  Return instructions:  SEEK IMMEDIATE MEDICAL ATTENTION IF: There is confusion or drowsiness (although children frequently become drowsy after injury).  You cannot awaken the injured person.  You have more than one episode of vomiting.  You notice dizziness or unsteadiness which is getting worse, or inability to walk.  You have convulsions or unconsciousness.  You experience severe, persistent headaches not relieved by Tylenol. You cannot use arms or legs normally.  There are changes in pupil sizes. (This is the black center in the colored part of the eye)  There is clear or bloody discharge from the nose or ears.  You have change in speech, vision, swallowing, or  understanding.  Localized weakness, numbness, tingling, or change in bowel or bladder control. You have any other emergent concerns.  Additional Information: You have had a head injury which does not appear to require admission at this time.  Your vital signs today were: BP (!) 159/77 (BP Location: Right Arm)    Pulse 87    Temp 98.8 F (37.1 C) (Oral)    Resp 18    SpO2 98%  If your blood pressure (BP) was elevated above 135/85 this visit, please have this repeated by your doctor within one month. --------------

## 2021-06-29 ENCOUNTER — Telehealth (HOSPITAL_COMMUNITY): Payer: Self-pay | Admitting: Emergency Medicine

## 2021-06-29 ENCOUNTER — Telehealth: Payer: Self-pay

## 2021-06-29 MED ORDER — OXYCODONE HCL 5 MG PO TABS: 2.5000 mg | ORAL_TABLET | Freq: Four times a day (QID) | ORAL | 0 refills | Status: AC | PRN

## 2021-06-29 NOTE — Telephone Encounter (Signed)
Patients son called to state that the pharmacy is closed that has her prescriptions. Messaged provider and medication got changed. Called son thurman back to let him know that the medication was electronically sent to his requested pharmacy

## 2021-06-29 NOTE — Telephone Encounter (Signed)
I was contacted about changing pharmacy for oxycodone prescription --as previous pharmacy is closed today.  I sent prescription into [facility] on [address] per patient request.

## 2021-08-27 ENCOUNTER — Ambulatory Visit: Payer: Medicare Other | Admitting: Podiatry

## 2021-08-30 ENCOUNTER — Ambulatory Visit: Payer: Medicare Other | Admitting: Podiatry

## 2021-08-30 ENCOUNTER — Other Ambulatory Visit: Payer: Self-pay

## 2021-08-30 ENCOUNTER — Encounter: Payer: Self-pay | Admitting: Podiatry

## 2021-08-30 DIAGNOSIS — M79674 Pain in right toe(s): Secondary | ICD-10-CM | POA: Diagnosis not present

## 2021-08-30 DIAGNOSIS — M79675 Pain in left toe(s): Secondary | ICD-10-CM | POA: Diagnosis not present

## 2021-08-30 DIAGNOSIS — H9190 Unspecified hearing loss, unspecified ear: Secondary | ICD-10-CM | POA: Insufficient documentation

## 2021-08-30 DIAGNOSIS — L84 Corns and callosities: Secondary | ICD-10-CM | POA: Diagnosis not present

## 2021-08-30 DIAGNOSIS — E538 Deficiency of other specified B group vitamins: Secondary | ICD-10-CM | POA: Insufficient documentation

## 2021-08-30 DIAGNOSIS — E876 Hypokalemia: Secondary | ICD-10-CM | POA: Insufficient documentation

## 2021-08-30 DIAGNOSIS — E611 Iron deficiency: Secondary | ICD-10-CM | POA: Insufficient documentation

## 2021-08-30 DIAGNOSIS — G629 Polyneuropathy, unspecified: Secondary | ICD-10-CM

## 2021-08-30 DIAGNOSIS — B351 Tinea unguium: Secondary | ICD-10-CM | POA: Diagnosis not present

## 2021-08-30 DIAGNOSIS — L601 Onycholysis: Secondary | ICD-10-CM | POA: Diagnosis not present

## 2021-08-30 DIAGNOSIS — E441 Mild protein-calorie malnutrition: Secondary | ICD-10-CM | POA: Insufficient documentation

## 2021-09-08 NOTE — Progress Notes (Signed)
?  Subjective:  ?Patient ID: [name], female    DOB: 14-Apr-1925,  MRN: [id number] ? ?[name] presents to clinic today for at risk foot care with history of peripheral neuropathy and callus(es) left foot and painful thick toenails that are difficult to trim. Painful toenails interfere with ambulation. Aggravating factors include wearing enclosed shoe gear. Pain is relieved with periodic professional debridement. Painful calluses are aggravated when weightbearing with and without shoegear. Pain is relieved with periodic professional debridement. ? ?New problem(s): None.  ? ?PCP is [name], MD , and last visit was May 08, 2021. ? ?Allergies  ?Allergen Reactions  ? Iodine Hives  ? ? ?Review of Systems: Negative except as noted in the HPI. ? ?Objective:  ?General: Patient is a pleasant 88 y.o. African American female thin build in NAD. AAO x 3.  ? ?Neurovascular Examination: ?CFT immediate b/l LE. Palpable DP/PT pulses b/l LE. Digital hair sparse b/l. Skin temperature gradient WNL b/l. No pain with calf compression b/l. No edema noted b/l. No cyanosis or clubbing noted b/l LE. ? ?Protective sensation decreased with 10 gram monofilament b/l. Vibratory sensation intact b/l. ? ?Dermatological:  ?Pedal integument with normal turgor, texture and tone b/l LE. No open wounds b/l. No interdigital macerations b/l. There is noted onchyolysis of entire nailplate of R hallux.  The nailbeds remain intact. There is no erythema, no edema, no drainage, no underlying fluctuance. There is s pinpoint area of granulation tissue on the nailbed distal to proximal nailfold. ? ?Musculoskeletal:  ?Muscle strength 5/5 to all lower extremity muscle groups bilaterally. No pain, crepitus or joint limitation noted with ROM bilateral LE. HAV with bunion deformity noted b/l LE. Pes planus deformity noted bilateral LE. Hammertoes 2-5 b/l. ? ?Assessment/Plan: ?1. Pain due to onychomycosis of toenails of both feet   ?2. Callus    ?3. Onycholysis of toenail   ?4. Neuropathy   ?  ?-Loose nailplate right great toenail debrided en toto without complication. Small pinpoint granulation tissue addressed with silver nitrate. Nailbed cleansed with alcohol No further treatment required. ?-Toenails 2-5 bilaterally and L hallux debrided in length and girth without iatrogenic bleeding with sterile nail nipper and dremel.  ?-Callus(es) submet head 1 left foot pared utilizing sterile scalpel blade without complication or incident. Total number debrided =1. ?-Patient/POA to call should there be question/concern in the interim.  ? ?Return in about 3 months (around 11/30/2021). ? ?Freddie Breech, DPM  ?

## 2021-11-26 ENCOUNTER — Ambulatory Visit: Payer: Medicare Other | Admitting: Orthopedic Surgery

## 2021-11-26 ENCOUNTER — Encounter: Payer: Self-pay | Admitting: Orthopedic Surgery

## 2021-11-26 DIAGNOSIS — M7022 Olecranon bursitis, left elbow: Secondary | ICD-10-CM | POA: Insufficient documentation

## 2021-11-26 MED ORDER — MELOXICAM 7.5 MG PO TABS: 7.5000 mg | ORAL_TABLET | Freq: Every day | ORAL | 0 refills | Status: AC

## 2021-11-26 NOTE — Progress Notes (Signed)
Office Visit Note   Patient: [name]           Date of Birth: Feb 17, 1925           MRN: [id number] Visit Date: 11/26/2021              Requested by: Shon Baton, [address],  [place] [address] PCP: Shon Baton, MD   Assessment & Plan: Visit Diagnoses:  1. Olecranon bursitis of left elbow     Plan: Patient is olecranon bursitis of left elbow.  It is not painful.  We discussed the nature olecranon bursitis as well as both conservative and surgical treatment options.  We discussed use of a compressive dressing with an oral anti-inflammatory medication versus needle aspiration.  After our discussion, she and her son would like to try an oral anti-inflammatory compressive dressing first.  I will see her back in a month if she still symptomatic.  Follow-Up Instructions: No follow-ups on file.   Orders:  No orders of the defined types were placed in this encounter.  Meds ordered this encounter  Medications   meloxicam (MOBIC) 7.5 MG tablet    Sig: Take 1 tablet (7.5 mg total) by mouth daily.    Dispense:  30 tablet    Refill:  0      Procedures: No procedures performed   Clinical Data: No additional findings.   Subjective: Chief Complaint  Patient presents with   Left Elbow - Pain    This is a 88 year old right-hand-dominant female presents with painless swelling of the posterior aspect of the left elbow.  This been going on for around 3 months or so.  She denies any trauma to the elbow.  This is not symptomatic for her at all.  She notes that overall the swelling has improved significantly over the last month or so.  It is now at least 25 to 50% smaller.  She denies any issue with the right elbow.    Review of Systems   Objective: Vital Signs: Ht 5\' 4"  (1.626 m)   Wt 137 lb (62.1 kg)   BMI 23.52 kg/m   Physical Exam Constitutional:      Appearance: Normal appearance.  Cardiovascular:     Rate and Rhythm: Normal rate.     Pulses:  Normal pulses.  Pulmonary:     Effort: Pulmonary effort is normal.  Skin:    General: Skin is warm and dry.     Capillary Refill: Capillary refill takes less than 2 seconds.  Neurological:     Mental Status: She is alert.     Left Elbow Exam   Tenderness  The patient is experiencing no tenderness.   Range of Motion  The patient has normal left elbow ROM.  Other  Erythema: absent Sensation: normal Pulse: present  Comments:  Focal swelling of the posterior elbow over the olecranon.       Specialty Comments:  No specialty comments available.  Imaging: No results found.   PMFS History: Patient Active Problem List   Diagnosis Date Noted   Olecranon bursitis of left elbow 11/26/2021   Hearing loss 08/30/2021   Hypokalemia 08/30/2021   Iron deficiency 08/30/2021   Malnutrition of mild degree Altamease Oiler: 75% to less than 90% of standard weight) ([place]) 08/30/2021   Vitamin B12 deficiency (non anemic) 123456   Acute metabolic encephalopathy Q000111Q   Chronic kidney disease, stage 3a ([place]) 02/10/2021   Chronic diastolic CHF (congestive heart failure) ([place]) 02/10/2021  Toxic metabolic encephalopathy Q000111Q   Advanced age 49/07/2020   Infection of toenail 11/15/2020   Pain in limb 11/15/2020   Xerosis cutis 01/06/2020   Adult failure to thrive syndrome 10/18/2019   Chest pain 04/19/2019   Tear film insufficiency 04/19/2019   Shoulder joint pain 02/20/2017   Hypertensive heart disease without congestive heart failure 12/19/2014   Atherosclerotic heart disease of native coronary artery without angina pectoris 05/04/2014   Cardiomegaly 03/14/2014   Encounter for screening for other disorder 03/07/2013   Syncope 09/29/2012   Essential hypertension 09/29/2012   Hyperlipidemia 09/29/2012   Anemia 09/29/2012   Chronic abdominal pain 09/29/2012   Varicose veins of unspecified lower extremity with other complications [id number]   Vitamin D deficiency 05/30/2011    Disequilibrium 12/21/2009   Age-related osteoporosis without current pathological fracture 04/18/2009   GERD without esophagitis 04/18/2009   Osteoarthritis 04/18/2009   Past Medical History:  Diagnosis Date   Diverticular disease    GERD (gastroesophageal reflux disease)    Hiatal hernia    High cholesterol    Hypertension     Family History  Problem Relation Age of Onset   Heart disease Neg Hx     Past Surgical History:  Procedure Laterality Date   ABDOMINAL HYSTERECTOMY     HYSTEROTOMY     LIVER BIOPSY     Social History   Occupational History   Not on file  Tobacco Use   Smoking status: Never   Smokeless tobacco: Never  Vaping Use   Vaping Use: Never used  Substance and Sexual Activity   Alcohol use: No   Drug use: No   Sexual activity: Not Currently

## 2021-12-06 ENCOUNTER — Ambulatory Visit (INDEPENDENT_AMBULATORY_CARE_PROVIDER_SITE_OTHER): Payer: Medicare Other | Admitting: Podiatry

## 2021-12-06 DIAGNOSIS — Z91199 Patient's noncompliance with other medical treatment and regimen due to unspecified reason: Secondary | ICD-10-CM

## 2021-12-18 ENCOUNTER — Encounter: Payer: Self-pay | Admitting: Podiatry

## 2021-12-18 ENCOUNTER — Ambulatory Visit: Payer: Medicare Other | Admitting: Podiatry

## 2021-12-18 DIAGNOSIS — M79675 Pain in left toe(s): Secondary | ICD-10-CM

## 2021-12-18 DIAGNOSIS — M79674 Pain in right toe(s): Secondary | ICD-10-CM | POA: Diagnosis not present

## 2021-12-18 DIAGNOSIS — B351 Tinea unguium: Secondary | ICD-10-CM | POA: Diagnosis not present

## 2021-12-18 NOTE — Progress Notes (Signed)
Subjective:   Patient ID: [name], female   DOB: 88 y.o.   MRN: [id number]   HPI Patient presents with thickened deformed nailbeds 1-5 both feet that are painful and she cannot cut   ROS      Objective:  Physical Exam  Neurovascular status intact with thick yellow brittle nailbeds 1-5 both feet painful     Assessment:  Chronic mycotic nail infection with pain 1-5 both feet     Plan:  Debridement painful nailbeds 1-5 both feet neurogenic bleeding reappoint routine care

## 2022-01-24 IMAGING — CT CT CERVICAL SPINE W/O CM
3 series · 11 of 33 positions shown, 13 images · non-contrast
Comparison: None.

CLINICAL DATA: Status post fall.



[Series 5: c spine soft · axial · 0.27mm/px · z∈[+1183,+1283]mm · 3 of 82 slices shown, 4 images]
[im 19/82  soft-tissue]
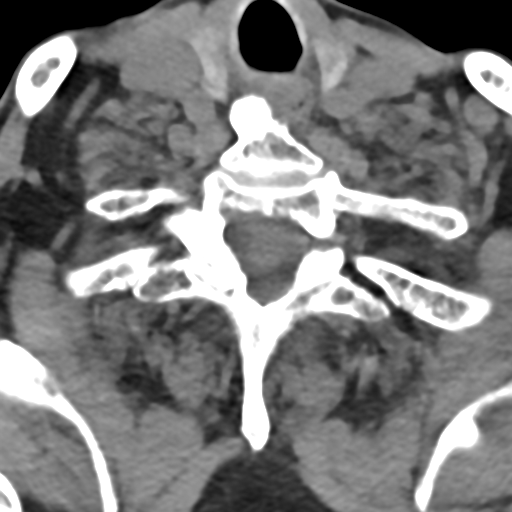
[im 19/82  bone]
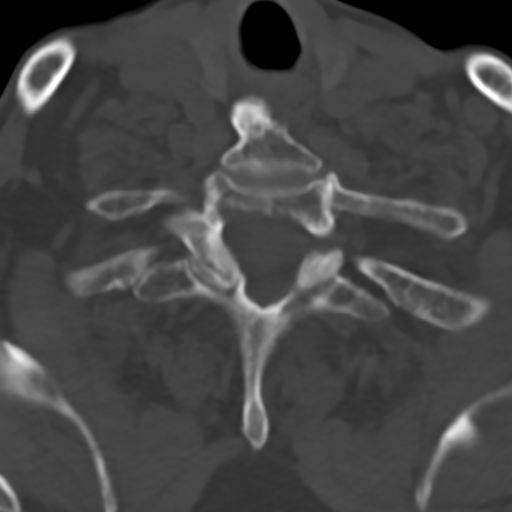
[im 44/82  bone]
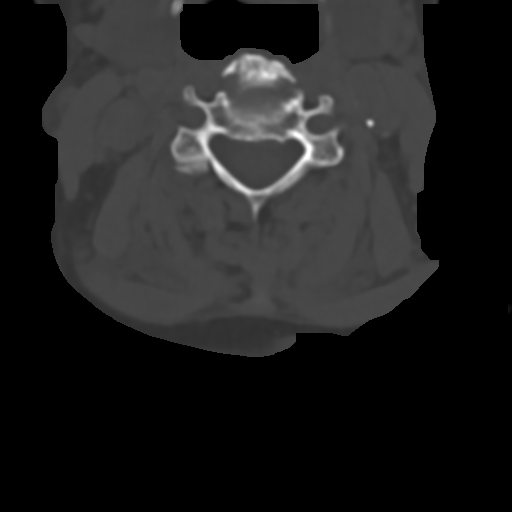
[im 69/82  bone]
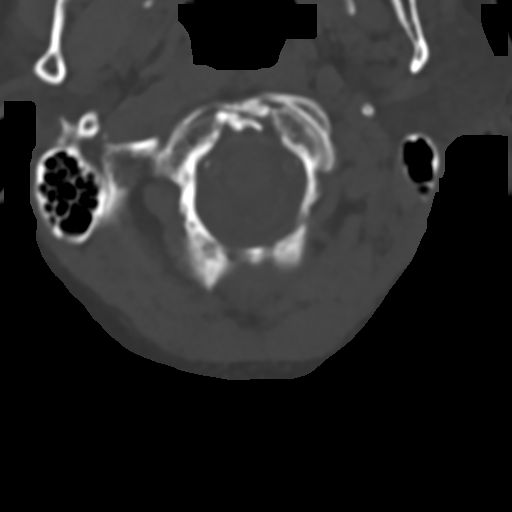

[Series 8: sag bone · sagittal · 0.24mm/px · 5 of 83 slices shown, 6 images]
[im 28/83  bone]
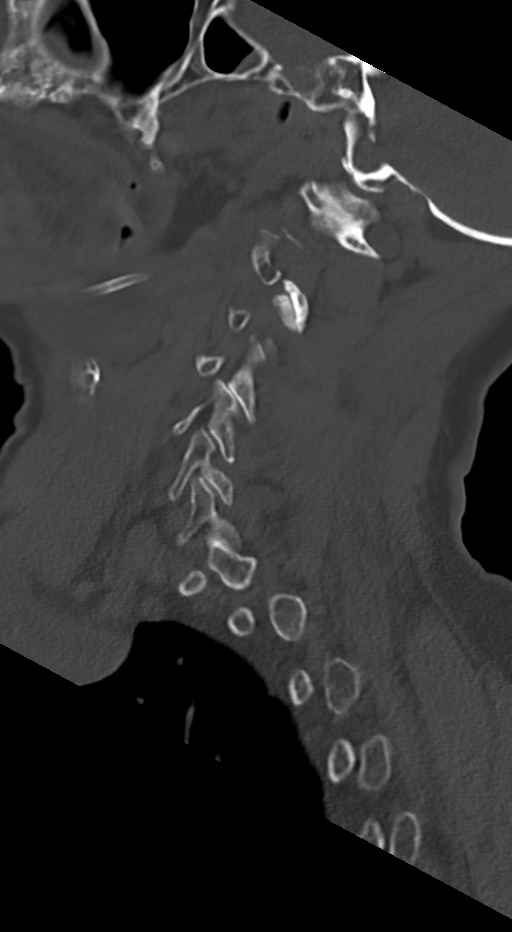
[im 35/83  bone]
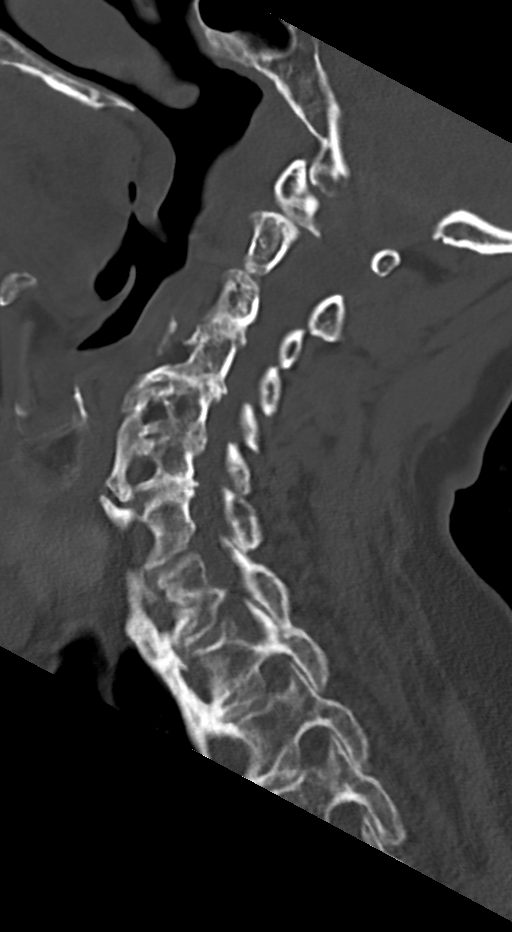
[im 42/83  soft-tissue]
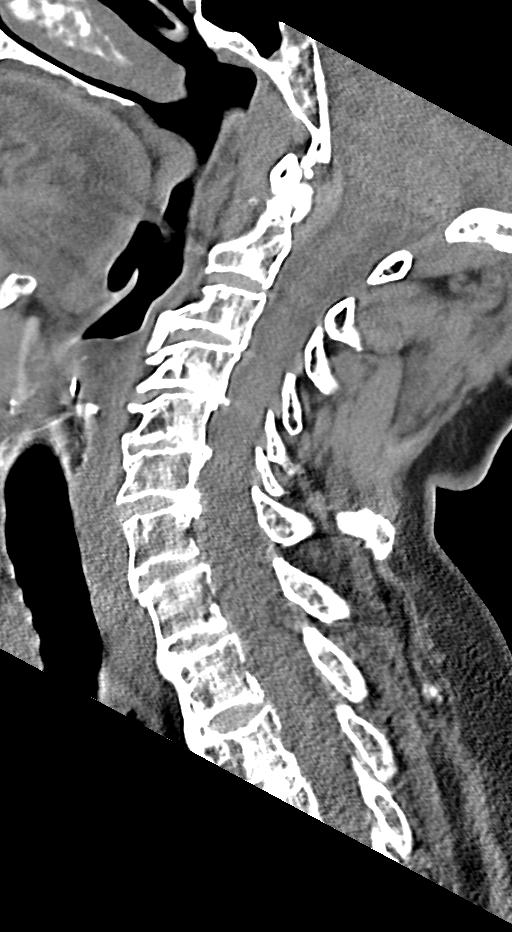
[im 42/83  bone]
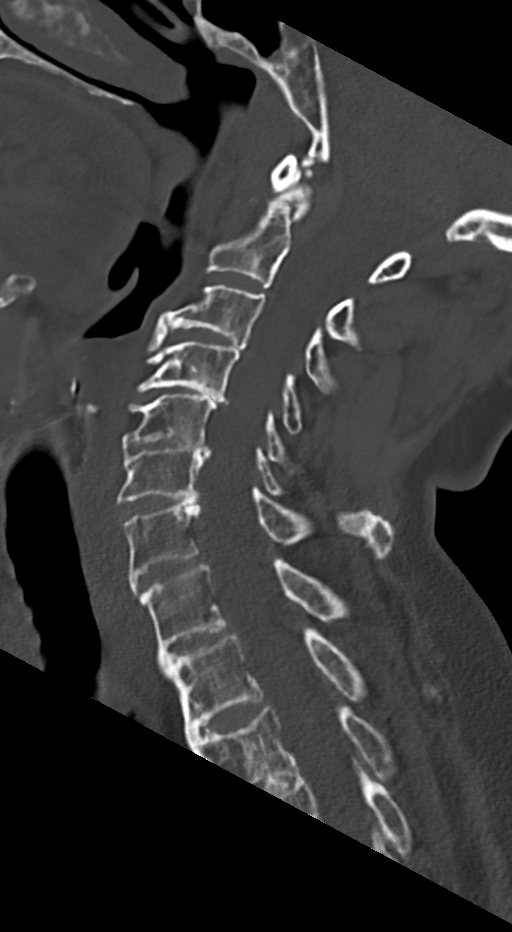
[im 48/83  bone]
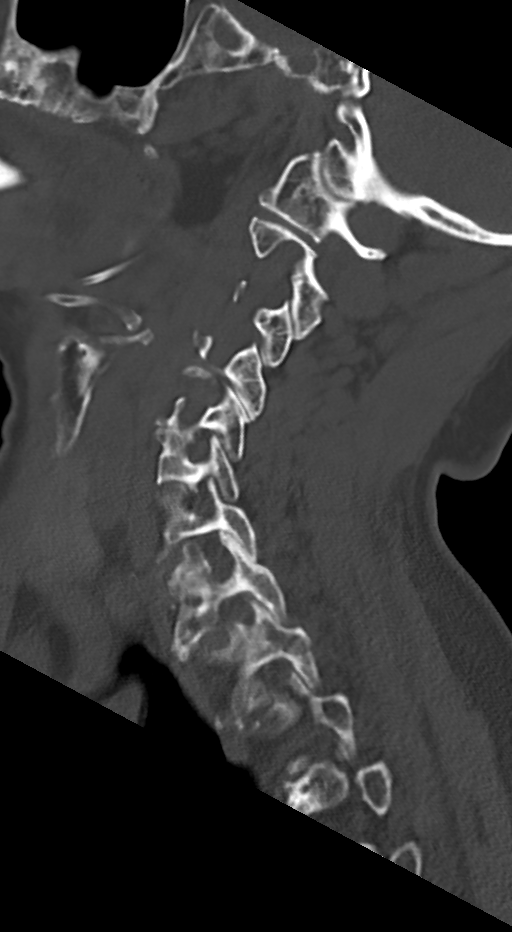
[im 55/83  bone]
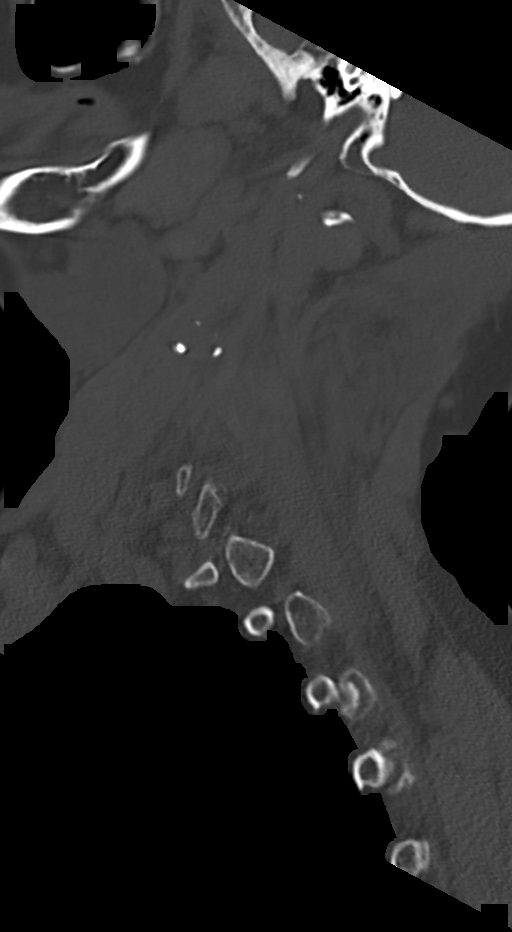

[Series 9: cor bone · coronal · 0.32mm/px · 3 of 62 slices shown]
[im 16/62  bone]
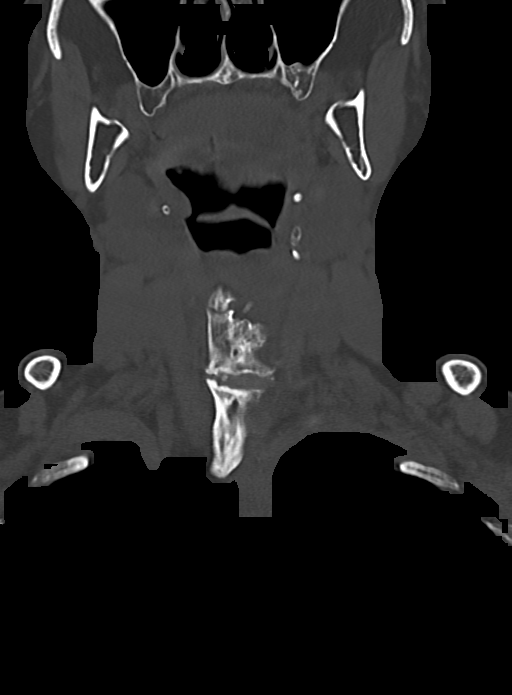
[im 26/62  bone]
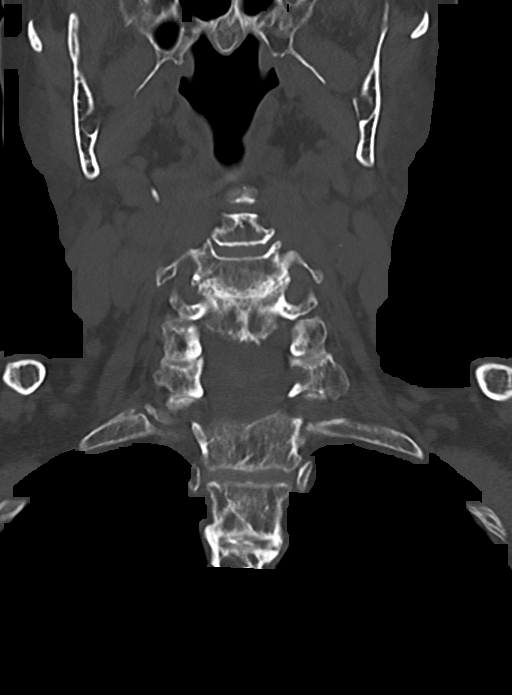
[im 36/62  bone]
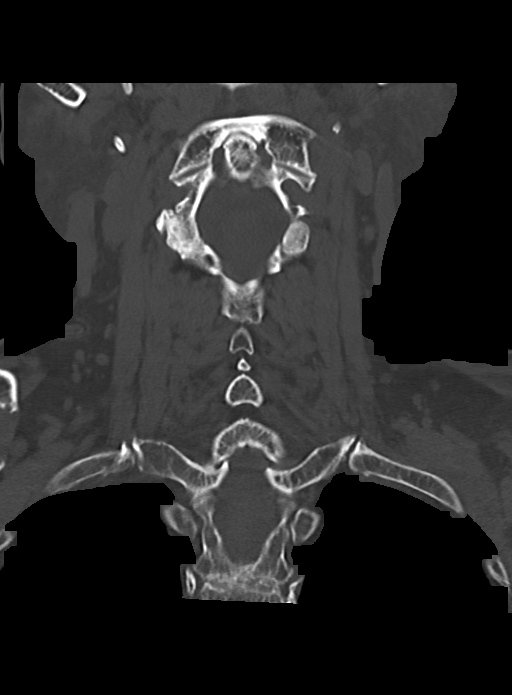

[11 of 33 positions shown; findings below may reference images not displayed]

FINDINGS: Alignment: Normal.

Skull base and vertebrae: An acute, nondisplaced fracture deformity
is seen extending through the base of the dens.

Soft tissues and spinal canal: No prevertebral fluid or swelling. No
visible canal hematoma.

Disc levels: Marked severity anterior longitudinal ligament
calcification, endplate sclerosis and associated anterior osteophyte
formation are seen at the levels of C3-C4, C4-C5, C5-C6, C6-C7 and
C7-T1.

There is moderate to marked severity narrowing of the anterior
atlantoaxial articulation. Marked severity intervertebral disc space
narrowing is seen at C5-C6 with moderate to marked severity
narrowing at C4-C5. Mild to moderate severity narrowing is seen at
the levels of C2-C3, C3-C4, C6-C7 and C7-T1.

Bilateral moderate severity multilevel facet joint hypertrophy is
noted.

Upper chest: Negative.

Other: None.
IMPRESSION: 1. Acute, unstable nondisplaced fracture deformity extending through
the base of the dens (Type 2).
2. Marked severity multilevel degenerative changes, as described
above.

## 2022-01-24 IMAGING — CT CT HEAD W/O CM
4 series · 16 of 47 positions shown, 18 images · non-contrast
Comparison: February 09, 2021

CLINICAL DATA: Status post fall.



[Series 3: head wo · axial · 0.41mm/px · z∈[+1286,+1406]mm · 7 of 33 slices shown, 9 images]
[im 5/33  brain]
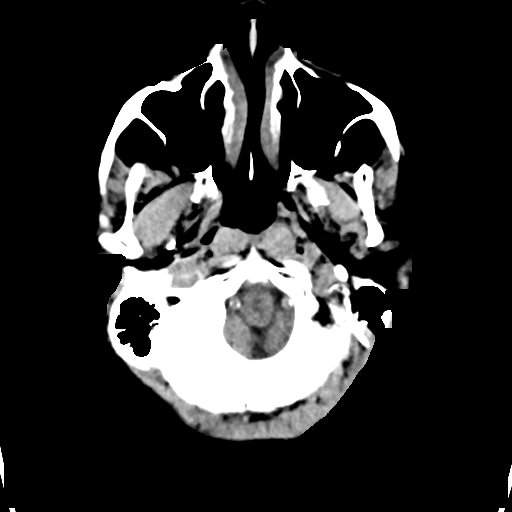
[im 5/33  bone]
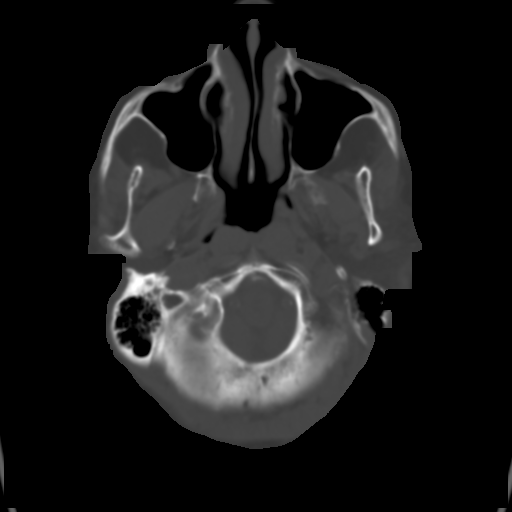
[im 9/33  brain]
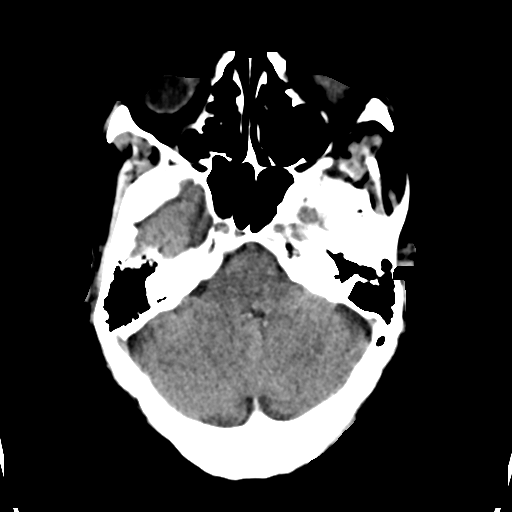
[im 13/33  brain]
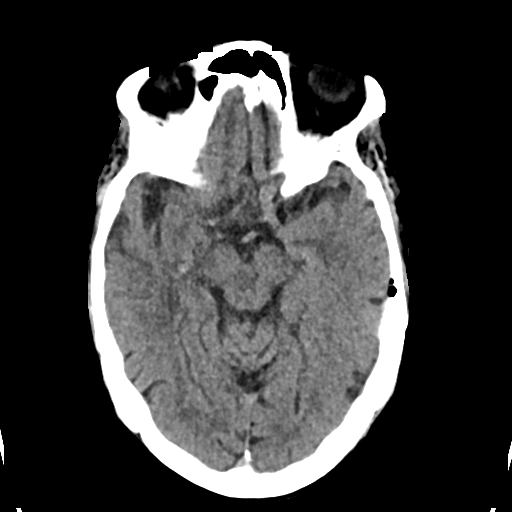
[im 17/33  brain]
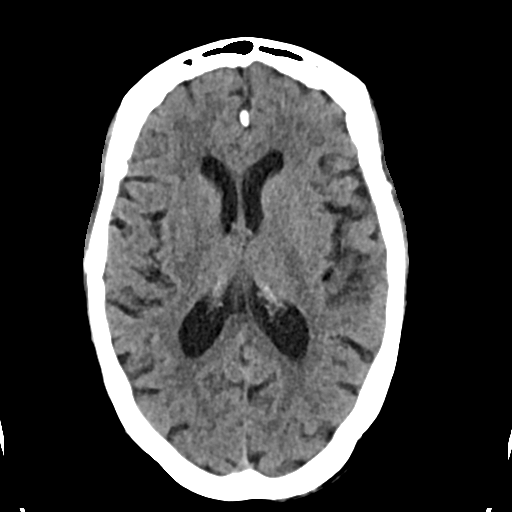
[im 21/33  brain]
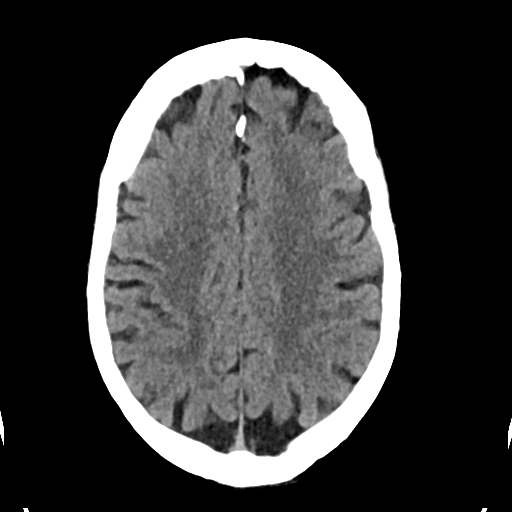
[im 21/33  bone]
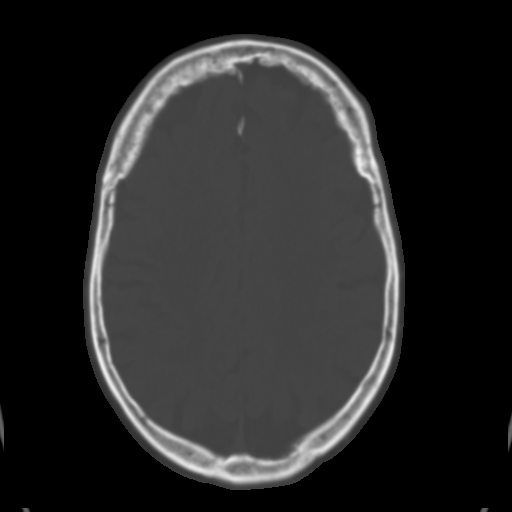
[im 25/33  brain]
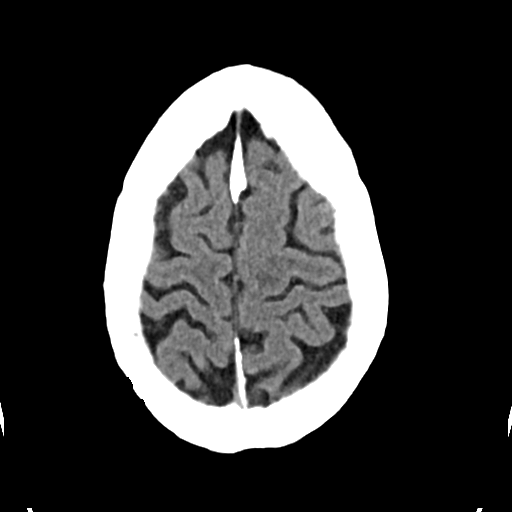
[im 29/33  brain]
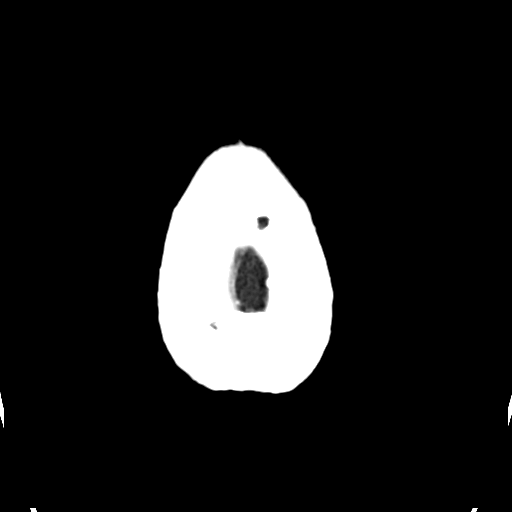

[Series 4: head bone · axial · 0.41mm/px · z∈[+1282,+1314]mm · 3 of 82 slices shown]
[im 9/82  bone]
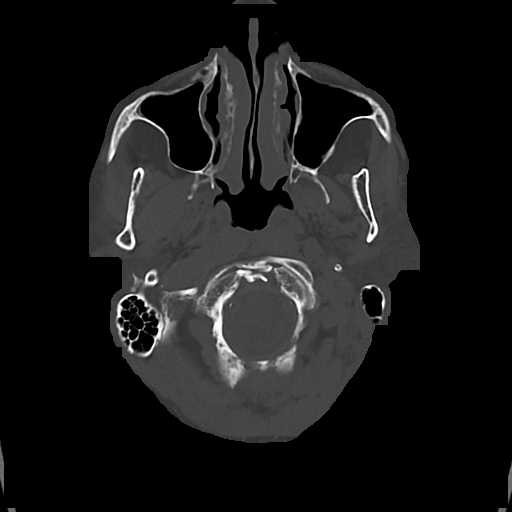
[im 17/82  bone]
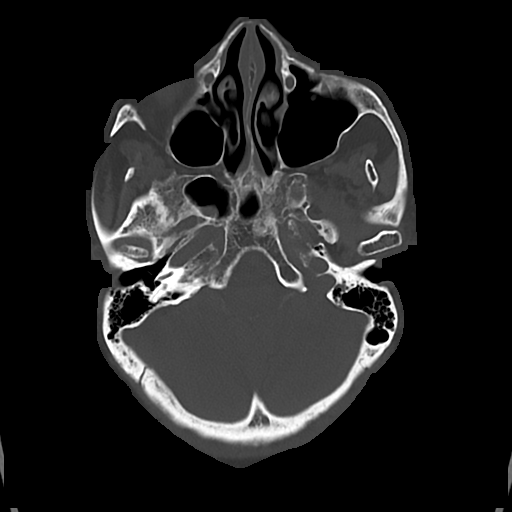
[im 25/82  bone]
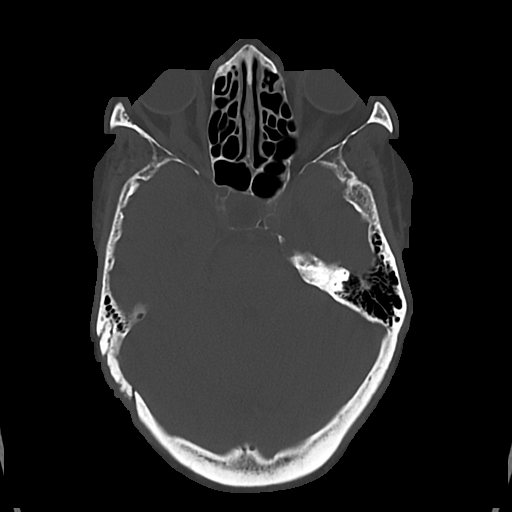

[Series 5: cor soft · coronal · 0.32mm/px · 3 of 69 slices shown]
[im 23/69  brain]
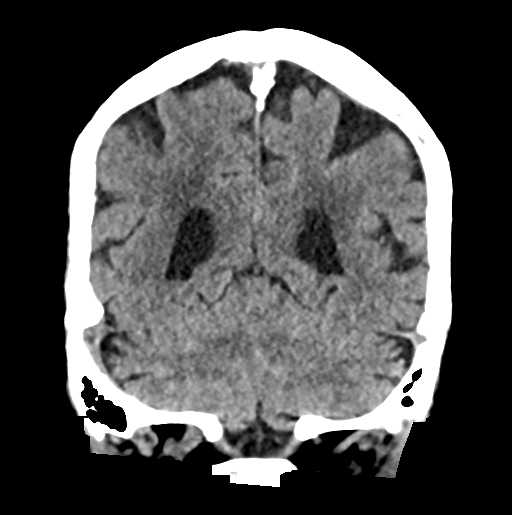
[im 31/69  brain]
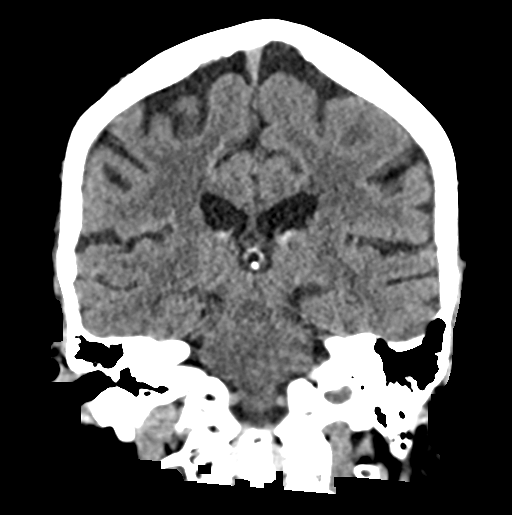
[im 38/69  brain]
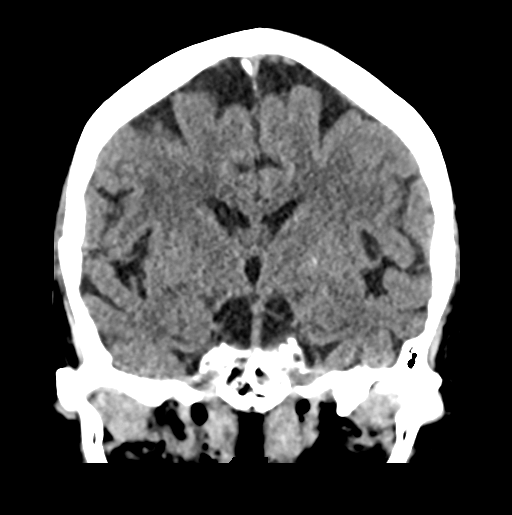

[Series 6: sag soft · sagittal · 0.33mm/px · 3 of 56 slices shown]
[im 19/56  brain]
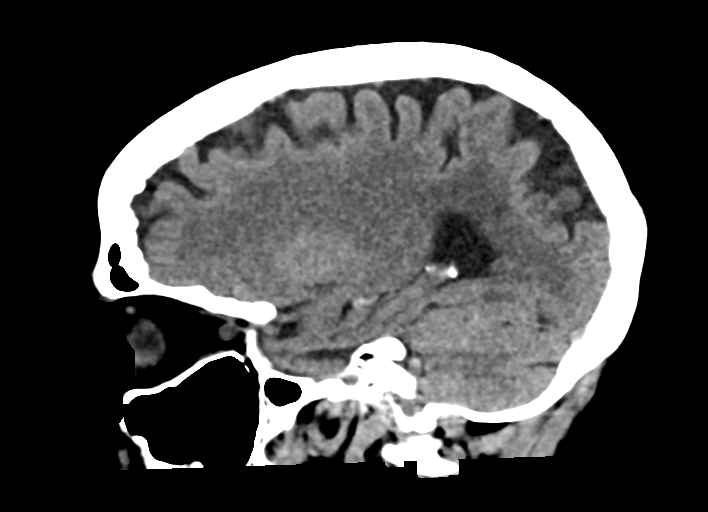
[im 28/56  brain]
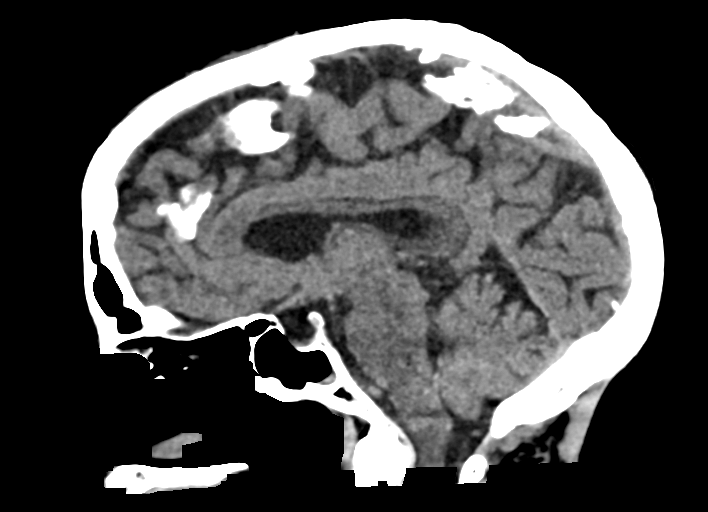
[im 37/56  brain]
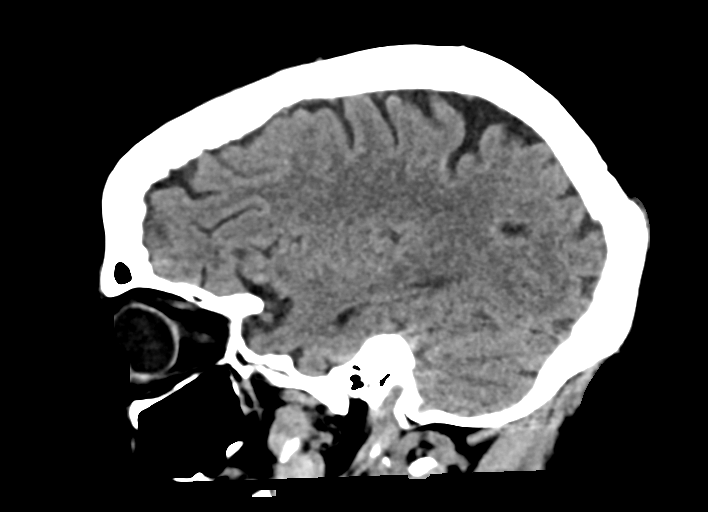

[16 of 47 positions shown; findings below may reference images not displayed]

FINDINGS: Brain: There is mild cerebral atrophy with widening of the
extra-axial spaces and ventricular dilatation.
There are areas of decreased attenuation within the white matter
tracts of the supratentorial brain, consistent with microvascular
disease changes.

Vascular: No hyperdense vessel or unexpected calcification.

Skull: A nondisplaced fracture is seen at the level of the base of
the dens (coronal reformatted image 37, CT series 5).

Sinuses/Orbits: There is mild sphenoid sinus mucosal thickening.

Other: None.
IMPRESSION: 1. Nondisplaced fracture at the level of the base of the dens (Type
2).
2. Generalized cerebral atrophy.
3. No acute intracranial abnormality.

## 2022-03-19 ENCOUNTER — Ambulatory Visit (INDEPENDENT_AMBULATORY_CARE_PROVIDER_SITE_OTHER): Payer: Medicare Other | Admitting: Podiatry

## 2022-03-19 DIAGNOSIS — Z91199 Patient's noncompliance with other medical treatment and regimen due to unspecified reason: Secondary | ICD-10-CM

## 2022-03-19 NOTE — Progress Notes (Signed)
1. No-show for appointment     

## 2022-08-11 ENCOUNTER — Ambulatory Visit (INDEPENDENT_AMBULATORY_CARE_PROVIDER_SITE_OTHER): Payer: Medicare Other | Admitting: Podiatry

## 2022-08-11 DIAGNOSIS — Z91199 Patient's noncompliance with other medical treatment and regimen due to unspecified reason: Secondary | ICD-10-CM

## 2022-08-11 NOTE — Progress Notes (Signed)
1. No-show for appointment

## 2023-01-29 ENCOUNTER — Ambulatory Visit: Payer: Medicare Other | Admitting: Podiatry

## 2023-01-29 DIAGNOSIS — M79675 Pain in left toe(s): Secondary | ICD-10-CM | POA: Diagnosis not present

## 2023-01-29 DIAGNOSIS — B351 Tinea unguium: Secondary | ICD-10-CM

## 2023-01-29 DIAGNOSIS — M79674 Pain in right toe(s): Secondary | ICD-10-CM

## 2023-01-29 NOTE — Progress Notes (Signed)
  Subjective:  Patient ID: [name], female    DOB: 08/19/1924,  MRN: [id number]  Chief Complaint  Patient presents with   Nail Problem    Routine Foot Care- nail trim     89 y.o. female presents with the above complaint. History confirmed with patient. Patient presenting with pain related to dystrophic thickened elongated nails. Patient is unable to trim own nails related to nail dystrophy and/or mobility issues. Patient does not have a history of T2DM.   Objective:  Physical Exam: warm, good capillary refill nail exam onychomycosis of the toenails, onycholysis, and dystrophic nails DP pulses palpable, PT pulses palpable, and protective sensation intact Left Foot:  Pain with palpation of nails due to elongation and dystrophic growth.  Right Foot: Pain with palpation of nails due to elongation and dystrophic growth.   Assessment:   1. Pain due to onychomycosis of toenails of both feet      Plan:  Patient was evaluated and treated and all questions answered.  #Onychomycosis with pain  -Nails palliatively debrided as below. -Educated on self-care  Procedure: Nail Debridement Rationale: Pain Type of Debridement: manual, sharp debridement. Instrumentation: Nail nipper, rotary burr. Number of Nails: 10  Return in about 3 months (around 05/01/2023).         Corinna Gab, DPM [facility]

## 2023-05-01 ENCOUNTER — Ambulatory Visit: Payer: Medicare Other | Admitting: Podiatry

## 2023-05-27 ENCOUNTER — Encounter: Payer: Self-pay | Admitting: Podiatry

## 2023-05-27 ENCOUNTER — Ambulatory Visit: Payer: Medicare Other | Admitting: Podiatry

## 2023-05-27 DIAGNOSIS — M79675 Pain in left toe(s): Secondary | ICD-10-CM | POA: Diagnosis not present

## 2023-05-27 DIAGNOSIS — M79674 Pain in right toe(s): Secondary | ICD-10-CM

## 2023-05-27 DIAGNOSIS — B351 Tinea unguium: Secondary | ICD-10-CM

## 2023-05-28 NOTE — Progress Notes (Signed)
Subjective:   Patient ID: [name], female   DOB: 89 y.o.   MRN: [id number]   HPI Patient presents with elongated nailbeds 1-5 both feet thick that she cannot cut in her painful   ROS      Objective:  Physical Exam  Neurovascular unchanged with thick yellow brittle nailbeds 1-5 both feet with pain upon pressure due to elongation and thickness     Assessment:  Chronic mycotic nail infection with pain 1-5 both feet     Plan:  Debridement of nailbeds 1-5 both feet no iatrogenic bleeding reappoint routine care

## 2023-08-26 ENCOUNTER — Ambulatory Visit: Payer: Medicare Other | Admitting: Podiatry

## 2023-10-28 ENCOUNTER — Ambulatory Visit: Admitting: Podiatry

## 2023-10-28 ENCOUNTER — Encounter: Payer: Self-pay | Admitting: Podiatry

## 2023-10-28 DIAGNOSIS — M79675 Pain in left toe(s): Secondary | ICD-10-CM

## 2023-10-28 DIAGNOSIS — B351 Tinea unguium: Secondary | ICD-10-CM | POA: Diagnosis not present

## 2023-10-28 DIAGNOSIS — M79674 Pain in right toe(s): Secondary | ICD-10-CM

## 2023-11-01 NOTE — Progress Notes (Signed)
  Subjective:  Patient ID: [name], female    DOB: 1925/01/18,  MRN: [id number]  89 y.o. female presents painful elongated mycotic toenails 1-5 bilaterally which are tender when wearing enclosed shoe gear. Pain is relieved with periodic professional debridement. Chief Complaint  Patient presents with   Nail Problem    RFC    New problem(s): None   PCP is Margarete Sharps, MD.  Allergies  Allergen Reactions   Iodine Hives    Review of Systems: Negative except as noted in the HPI.   Objective:  See Beharry is a pleasant 89 y.o. female WD, WN in NAD. AAO x 3.  Vascular Examination: Vascular status intact b/l with palpable pedal pulses. CFT immediate b/l. Pedal hair present. No edema. No pain with calf compression b/l. Skin temperature gradient WNL b/l. No varicosities noted. No cyanosis or clubbing noted.  Neurological Examination: Protective sensation decreased with 10 gram monofilament b/l.  Dermatological Examination: Pedal skin with normal turgor, texture and tone b/l. No open wounds nor interdigital macerations noted. Toenails 1-5 b/l thick, discolored, elongated with subungual debris and pain on dorsal palpation. No hyperkeratotic lesions noted b/l.   Musculoskeletal Examination: Muscle strength 5/5 to b/l LE.  No pain, crepitus noted b/l. No gross pedal deformities. Patient ambulates independently without assistive aids.   Radiographs: None  Last A1c:       No data to display           Assessment:   1. Pain due to onychomycosis of toenails of both feet     Plan:  Patient was evaluated and treated. All patient's and/or POA's questions/concerns addressed on today's visit. Toenails 1-5 debrided in length and girth without incident. Continue soft, supportive shoe gear daily. Report any pedal injuries to medical professional. Call office if there are any questions/concerns. -Patient/POA to call should there be question/concern in the interim.  Return  in about 3 months (around 01/28/2024).  Luella Sager, DPM      [place] LOCATION: [address] [place] [address]                   Office [phone]   [facility] LOCATION: [address] [place], [place] [address] Office [phone]

## 2024-02-09 ENCOUNTER — Ambulatory Visit (INDEPENDENT_AMBULATORY_CARE_PROVIDER_SITE_OTHER): Admitting: Podiatry

## 2024-02-09 DIAGNOSIS — Z91198 Patient's noncompliance with other medical treatment and regimen for other reason: Secondary | ICD-10-CM

## 2024-02-09 NOTE — Progress Notes (Signed)
 1. Failure to attend appointment with reason given    Patient rescheduled appointment.

## 2024-03-02 ENCOUNTER — Ambulatory Visit: Admitting: Podiatry

## 2024-03-02 ENCOUNTER — Encounter: Payer: Self-pay | Admitting: Podiatry

## 2024-03-02 DIAGNOSIS — G6289 Other specified polyneuropathies: Secondary | ICD-10-CM

## 2024-03-02 DIAGNOSIS — M79674 Pain in right toe(s): Secondary | ICD-10-CM

## 2024-03-02 DIAGNOSIS — B351 Tinea unguium: Secondary | ICD-10-CM

## 2024-03-02 DIAGNOSIS — M79675 Pain in left toe(s): Secondary | ICD-10-CM

## 2024-03-02 DIAGNOSIS — L84 Corns and callosities: Secondary | ICD-10-CM

## 2024-03-06 NOTE — Progress Notes (Signed)
  Subjective:  Patient ID: [name], female    DOB: 1924-07-30,  MRN: [id number]  [name] presents to clinic today for at risk foot care with history of peripheral neuropathy and callus(es) left foot and painful mycotic toenails that are difficult to trim. Painful toenails interfere with ambulation. Aggravating factors include wearing enclosed shoe gear. Pain is relieved with periodic professional debridement. Painful calluses are aggravated when weightbearing with and without shoegear. Pain is relieved with periodic professional debridement.  Chief Complaint  Patient presents with   RFC     RFC Non diabetic toenail trim. LOV with PCP 12/30/23.   New problem(s): None.   PCP is Onita Rush, MD.  Allergies  Allergen Reactions   Iodine Hives    Review of Systems: Negative except as noted in the HPI.  Objective: No changes noted in today's physical examination. There were no vitals filed for this visit. [name] is a pleasant 89 y.o. female WD, WN in NAD. AAO x 3.  Vascular Examination: Vascular status intact b/l with palpable pedal pulses. CFT immediate b/l. Pedal hair present. No edema. No pain with calf compression b/l. Skin temperature gradient WNL b/l. No varicosities noted. No cyanosis or clubbing noted.  Neurological Examination: Protective sensation decreased with 10 gram monofilament b/l.  Dermatological Examination: Pedal skin with normal turgor, texture and tone b/l. No open wounds nor interdigital macerations noted. Toenails 1-5 b/l thick, discolored, elongated with subungual debris and pain on dorsal palpation. Hyperkeratotic lesion(s) 1st metatarsal head left foot.  No erythema, no edema, no drainage, no fluctuance.   Musculoskeletal Examination: Muscle strength 5/5 to b/l LE.  No pain, crepitus noted b/l. No gross pedal deformities. Patient ambulates independently without assistive aids.   Radiographs: None  Assessment/Plan: 1. Pain due  to onychomycosis of toenails of both feet   2. Callus   3. Other polyneuropathy     Consent given for treatment. Patient examined. All patient's and/or POA's questions/concerns addressed on today's visit. Mycotic toenails 1-5 debrided in length and girth without incident. Callus(es) 1st metatarsal head left foot pared with sharp debridement without incident.Continue soft, supportive shoe gear daily. Report any pedal injuries to medical professional. Call office if there are any quesitons/concerns.  Return in about 3 months (around 06/01/2024).  Delon LITTIE Merlin, DPM      [place] LOCATION: [address], [place] [address]                   Office [phone]   [facility] LOCATION: [address] [place], [place] [address] Office [phone]

## 2024-06-21 ENCOUNTER — Ambulatory Visit (INDEPENDENT_AMBULATORY_CARE_PROVIDER_SITE_OTHER): Admitting: Podiatry

## 2024-06-21 DIAGNOSIS — Z91199 Patient's noncompliance with other medical treatment and regimen due to unspecified reason: Secondary | ICD-10-CM

## 2024-06-21 NOTE — Progress Notes (Signed)
 1. No-show for appointment
